# Patient Record
Sex: Female | Born: 1970
Health system: Southern US, Community
[De-identification: ages and names within clinical notes are randomized; demographics above are authoritative.]

## PROBLEM LIST (undated history)

## (undated) DIAGNOSIS — Z862 Personal history of diseases of the blood and blood-forming organs and certain disorders involving the immune mechanism: Secondary | ICD-10-CM

## (undated) DIAGNOSIS — M797 Fibromyalgia: Secondary | ICD-10-CM

## (undated) DIAGNOSIS — M1990 Unspecified osteoarthritis, unspecified site: Secondary | ICD-10-CM

## (undated) DIAGNOSIS — K219 Gastro-esophageal reflux disease without esophagitis: Secondary | ICD-10-CM

## (undated) DIAGNOSIS — E119 Type 2 diabetes mellitus without complications: Secondary | ICD-10-CM

## (undated) DIAGNOSIS — I10 Essential (primary) hypertension: Secondary | ICD-10-CM

## (undated) DIAGNOSIS — D649 Anemia, unspecified: Secondary | ICD-10-CM

## (undated) DIAGNOSIS — K900 Celiac disease: Secondary | ICD-10-CM

## (undated) DIAGNOSIS — L409 Psoriasis, unspecified: Secondary | ICD-10-CM

## (undated) DIAGNOSIS — F419 Anxiety disorder, unspecified: Secondary | ICD-10-CM

## (undated) DIAGNOSIS — T7840XA Allergy, unspecified, initial encounter: Secondary | ICD-10-CM

## (undated) DIAGNOSIS — E039 Hypothyroidism, unspecified: Secondary | ICD-10-CM

## (undated) DIAGNOSIS — E7800 Pure hypercholesterolemia, unspecified: Secondary | ICD-10-CM

## (undated) HISTORY — PX: ENDOMETRIAL ABLATION: SHX621

## (undated) HISTORY — DX: Celiac disease: K90.0

## (undated) HISTORY — DX: Unspecified osteoarthritis, unspecified site: M19.90

## (undated) HISTORY — DX: Essential (primary) hypertension: I10

## (undated) HISTORY — DX: Gastro-esophageal reflux disease without esophagitis: K21.9

## (undated) HISTORY — DX: Allergy, unspecified, initial encounter: T78.40XA

## (undated) HISTORY — DX: Anemia, unspecified: D64.9

## (undated) HISTORY — PX: TONSILLECTOMY: SUR1361

## (undated) HISTORY — DX: Personal history of diseases of the blood and blood-forming organs and certain disorders involving the immune mechanism: Z86.2

## (undated) HISTORY — DX: Hypothyroidism, unspecified: E03.9

## (undated) HISTORY — PX: ROTATOR CUFF REPAIR: SHX139

## (undated) HISTORY — DX: Type 2 diabetes mellitus without complications: E11.9

## (undated) HISTORY — PX: ESOPHAGOGASTRODUODENOSCOPY: SHX1529

## (undated) HISTORY — DX: Fibromyalgia: M79.7

## (undated) HISTORY — DX: Psoriasis, unspecified: L40.9

---

## 2001-04-07 ENCOUNTER — Encounter: Admission: RE | Admit: 2001-04-07 | Discharge: 2001-04-22 | Payer: Self-pay | Admitting: Orthopaedic Surgery

## 2001-12-01 ENCOUNTER — Other Ambulatory Visit: Admission: RE | Admit: 2001-12-01 | Discharge: 2001-12-01 | Payer: Self-pay | Admitting: Obstetrics and Gynecology

## 2002-01-14 ENCOUNTER — Emergency Department (HOSPITAL_COMMUNITY): Admission: EM | Admit: 2002-01-14 | Discharge: 2002-01-14 | Payer: Self-pay | Admitting: *Deleted

## 2002-01-14 ENCOUNTER — Encounter: Payer: Self-pay | Admitting: *Deleted

## 2002-05-04 ENCOUNTER — Ambulatory Visit (HOSPITAL_COMMUNITY): Admission: RE | Admit: 2002-05-04 | Discharge: 2002-05-04 | Payer: Self-pay | Admitting: Family Medicine

## 2002-05-04 ENCOUNTER — Encounter: Payer: Self-pay | Admitting: Family Medicine

## 2002-08-10 ENCOUNTER — Ambulatory Visit (HOSPITAL_BASED_OUTPATIENT_CLINIC_OR_DEPARTMENT_OTHER): Admission: RE | Admit: 2002-08-10 | Discharge: 2002-08-11 | Payer: Self-pay | Admitting: Otolaryngology

## 2002-08-10 ENCOUNTER — Encounter (INDEPENDENT_AMBULATORY_CARE_PROVIDER_SITE_OTHER): Payer: Self-pay | Admitting: *Deleted

## 2002-10-21 ENCOUNTER — Encounter: Payer: Self-pay | Admitting: Otolaryngology

## 2002-10-21 ENCOUNTER — Ambulatory Visit (HOSPITAL_COMMUNITY): Admission: RE | Admit: 2002-10-21 | Discharge: 2002-10-21 | Payer: Self-pay | Admitting: Otolaryngology

## 2003-12-20 ENCOUNTER — Emergency Department (HOSPITAL_COMMUNITY): Admission: EM | Admit: 2003-12-20 | Discharge: 2003-12-20 | Payer: Self-pay | Admitting: *Deleted

## 2003-12-26 ENCOUNTER — Ambulatory Visit (HOSPITAL_COMMUNITY): Admission: RE | Admit: 2003-12-26 | Discharge: 2003-12-26 | Payer: Self-pay | Admitting: Family Medicine

## 2004-01-17 ENCOUNTER — Ambulatory Visit (HOSPITAL_COMMUNITY): Admission: RE | Admit: 2004-01-17 | Discharge: 2004-01-17 | Payer: Self-pay | Admitting: Family Medicine

## 2004-10-23 ENCOUNTER — Ambulatory Visit (HOSPITAL_COMMUNITY): Admission: RE | Admit: 2004-10-23 | Discharge: 2004-10-23 | Payer: Self-pay | Admitting: Family Medicine

## 2006-05-08 ENCOUNTER — Ambulatory Visit: Payer: Self-pay | Admitting: Orthopedic Surgery

## 2007-10-14 ENCOUNTER — Ambulatory Visit (HOSPITAL_COMMUNITY): Admission: RE | Admit: 2007-10-14 | Discharge: 2007-10-14 | Payer: Self-pay | Admitting: Family Medicine

## 2007-11-12 ENCOUNTER — Ambulatory Visit (HOSPITAL_COMMUNITY): Admission: RE | Admit: 2007-11-12 | Discharge: 2007-11-12 | Payer: Self-pay | Admitting: Obstetrics and Gynecology

## 2007-11-12 ENCOUNTER — Encounter (INDEPENDENT_AMBULATORY_CARE_PROVIDER_SITE_OTHER): Payer: Self-pay | Admitting: Obstetrics and Gynecology

## 2008-08-14 ENCOUNTER — Emergency Department (HOSPITAL_COMMUNITY): Admission: EM | Admit: 2008-08-14 | Discharge: 2008-08-14 | Payer: Self-pay | Admitting: Emergency Medicine

## 2009-04-12 ENCOUNTER — Ambulatory Visit (HOSPITAL_COMMUNITY): Admission: RE | Admit: 2009-04-12 | Discharge: 2009-04-12 | Payer: Self-pay | Admitting: Obstetrics and Gynecology

## 2009-09-14 ENCOUNTER — Ambulatory Visit (HOSPITAL_COMMUNITY): Admission: RE | Admit: 2009-09-14 | Discharge: 2009-09-14 | Payer: Self-pay | Admitting: Family Medicine

## 2009-12-28 ENCOUNTER — Encounter: Admission: RE | Admit: 2009-12-28 | Discharge: 2009-12-28 | Payer: Self-pay | Admitting: Orthopedic Surgery

## 2011-01-20 ENCOUNTER — Encounter: Payer: Self-pay | Admitting: Orthopedic Surgery

## 2011-05-14 NOTE — Op Note (Signed)
Rebecca Carroll, Rebecca Carroll                  ACCOUNT NO.:  [id number]   MEDICAL RECORD NO.:  [id number]          PATIENT TYPE:  AMB   LOCATION:  SDC                           FACILITY:  [facility]   PHYSICIAN:  Malva Limes, M.D.    DATE OF BIRTH:  Sep 27, 1971   DATE OF PROCEDURE:  11/12/2007  DATE OF DISCHARGE:                               OPERATIVE REPORT   PREOPERATIVE DIAGNOSIS:  Menorrhagia.   POSTOPERATIVE DIAGNOSIS:  Menorrhagia.   PROCEDURE:  1. Dilatation and curettage.  2. NovaSure endometrial ablation.   SURGEON:  Malva Limes, M.D.   ANESTHESIA:  MAC with paracervical block.   SPECIMENS:  Endometrial curettings sent to pathology.   DRAINS:  Red rubber catheter in the bladder.   COMPLICATIONS:  None.   ESTIMATED BLOOD LOSS:  Minimal.   PROCEDURE:  Patient was taken to the operating room where she was placed  in the dorsal lithotomy position.  MAC anesthesia was administered.  She  was then prepped with Betadine and draped in the usual fashion for this  procedure.  Her bladder was drained with a catheter.  A sterile speculum  was placed into the vagina.  The patient had 20 cc of 1% lidocaine used  for a paracervical block.  A single-tooth tenaculum was applied to the  anterior cervical lip.  The cervix was fairly dilated to a 31 [place].  The uterus was sounded to 8.5 cm.  The cervical length was measured at 3  cm, giving it an intracavitary length of 5.5 cm.  At this point, sharp  curettage was performed, and the tissue sent to pathology.  The NovaSure  device was then placed into the uterine cavity and opened.  The width  was 3 cm.  A seal test was performed and passed.  The device was then  turned on for 1 minute, 3 seconds.   The patient tolerated the procedure well.  The device was removed.  The  patient was taken to the recovery room in stable condition.  Estimated  lap counts were correct x1.  The devices were removed.  The patient was  taken to the recovery room in  stable condition.  Instrument and lap  counts were correct x1.   The patient will be discharged to home.  She will be sent home with  Percocet to take p.r.n.  She will follow up in the office in four weeks.           ______________________________  Malva Limes, M.D.     MA/MEDQ  D:  11/12/2007  T:  11/12/2007  Job:  [id number]

## 2011-05-17 NOTE — Op Note (Signed)
   NAME:  Rebecca Carroll, SLIGAR                           ACCOUNT NO.:  [id number]   MEDICAL RECORD NO.:  [id number]                   PATIENT TYPE:  AMB   LOCATION:  DSC                                  FACILITY:  [facility]   PHYSICIAN:  Dorna Leitz, M.D.                 DATE OF BIRTH:  Oct 27, 1971   DATE OF PROCEDURE:  08/10/2002  DATE OF DISCHARGE:  08/11/2002                                 OPERATIVE REPORT   PREOPERATIVE DIAGNOSES:  Chronic tonsillitis.   POSTOPERATIVE DIAGNOSES:  Chronic tonsillitis.   OPERATION PERFORMED:  Tonsillectomy.   SURGEON:  Dorna Leitz, M.D.   ANESTHESIA:  General.   ESTIMATED BLOOD LOSS:  None.   COMPLICATIONS:  None.   INDICATIONS FOR PROCEDURE:  The patient is a 40 year old female who has had  chronic throat pain with recurrent tonsillar infections.  She has also had  zone 2 adenopathy.  For these reasons, tonsillectomy is performed.   FINDINGS:  The patient was noted to have somewhat cryptic and scarred  bilateral palatine tonsils.  There was minimal adenoid tissue and for this  reason, adenoidectomy was not performed.   DESCRIPTION OF PROCEDURE:  The patient was taken to the operating room and  placed on the table in the supine position.  She was then placed under  general endotracheal anesthesia and the table rotated counterclockwise 90  degrees.  The head was draped as well as the body in the usual fashion.  Bacitracin ointment was placed on the lips.  A Crowe-Davis mouth gag with a  #3 tongue blade was then placed intraorally, opened and suspended on the  Mayo stand.  Inspection of the nasopharynx was performed using a mirror.  The right palatine tonsil was grasped with Allis clamps and directed  inferomedially.  The Harmonic scalpel was then used to resect the tonsils  staying within the peritonsillar space.  The left palatine tonsil was  removed in identical fashion.  An NG tube was placed down the esophagus for  suctioning of the gastric  contents.  The mouth gag was removed noting no  damage to the teeth or soft tissues.  The table was then rotated clockwise  90 degrees to its original position.  The patient was awakened from  anesthesia and extubated in the operating room.  She was taken to the post  anesthesia care unit in stable condition.                                                 Dorna Leitz, M.D.    SLJ/MEDQ  D:  09/16/2002  T:  09/17/2002  Job:  [id number]

## 2011-05-23 ENCOUNTER — Other Ambulatory Visit: Payer: Self-pay | Admitting: Family Medicine

## 2011-05-23 DIAGNOSIS — R1011 Right upper quadrant pain: Secondary | ICD-10-CM

## 2011-05-24 ENCOUNTER — Ambulatory Visit (HOSPITAL_COMMUNITY)
Admission: RE | Admit: 2011-05-24 | Discharge: 2011-05-24 | Disposition: A | Discharge status: Home / Self Care | Payer: 59 | Source: Ambulatory Visit | Attending: Family Medicine | Admitting: Family Medicine

## 2011-05-24 DIAGNOSIS — R1011 Right upper quadrant pain: Secondary | ICD-10-CM | POA: Insufficient documentation

## 2011-06-04 ENCOUNTER — Other Ambulatory Visit: Payer: Self-pay | Admitting: Family Medicine

## 2011-06-07 ENCOUNTER — Encounter (HOSPITAL_COMMUNITY): Admission: RE | Admit: 2011-06-07 | Payer: 59 | Source: Ambulatory Visit

## 2011-06-12 ENCOUNTER — Encounter (HOSPITAL_COMMUNITY): Payer: 59

## 2011-06-18 ENCOUNTER — Encounter (HOSPITAL_COMMUNITY): Admission: RE | Admit: 2011-06-18 | Payer: 59 | Source: Ambulatory Visit

## 2011-07-12 DIAGNOSIS — F411 Generalized anxiety disorder: Secondary | ICD-10-CM | POA: Insufficient documentation

## 2011-07-12 DIAGNOSIS — R6889 Other general symptoms and signs: Secondary | ICD-10-CM | POA: Insufficient documentation

## 2011-07-13 ENCOUNTER — Encounter: Payer: Self-pay | Admitting: Emergency Medicine

## 2011-07-13 ENCOUNTER — Emergency Department (HOSPITAL_COMMUNITY)
Admission: EM | Admit: 2011-07-13 | Discharge: 2011-07-13 | Disposition: A | Discharge status: Home / Self Care | Payer: 59 | Attending: Emergency Medicine | Admitting: Emergency Medicine

## 2011-07-13 HISTORY — DX: Anxiety disorder, unspecified: F41.9

## 2011-07-13 HISTORY — DX: Pure hypercholesterolemia, unspecified: E78.00

## 2011-07-13 NOTE — ED Notes (Signed)
Patient called for room placement; 2nd time; no answer.

## 2011-07-13 NOTE — ED Notes (Signed)
Patient states her anxiety has increased today; states she has a feeling of something being stuck in her throat.  Patient states she has appointment with ENT for this, but thinks her anxiety level is making it worse.

## 2011-07-13 NOTE — ED Notes (Signed)
Called for room placement, no answer.

## 2011-07-13 NOTE — ED Notes (Signed)
Patient called third time for room placement with no answer.

## 2011-08-23 ENCOUNTER — Inpatient Hospital Stay (INDEPENDENT_AMBULATORY_CARE_PROVIDER_SITE_OTHER)
Admission: RE | Admit: 2011-08-23 | Discharge: 2011-08-23 | Disposition: A | Discharge status: Home / Self Care | Payer: 59 | Source: Ambulatory Visit | Attending: Emergency Medicine | Admitting: Emergency Medicine

## 2011-08-23 DIAGNOSIS — J40 Bronchitis, not specified as acute or chronic: Secondary | ICD-10-CM

## 2011-08-29 ENCOUNTER — Inpatient Hospital Stay (INDEPENDENT_AMBULATORY_CARE_PROVIDER_SITE_OTHER)
Admission: RE | Admit: 2011-08-29 | Discharge: 2011-08-29 | Disposition: A | Discharge status: Home / Self Care | Payer: 59 | Source: Ambulatory Visit | Attending: Emergency Medicine | Admitting: Emergency Medicine

## 2011-08-29 DIAGNOSIS — R059 Cough, unspecified: Secondary | ICD-10-CM

## 2011-08-29 DIAGNOSIS — R05 Cough: Secondary | ICD-10-CM

## 2011-09-20 ENCOUNTER — Other Ambulatory Visit: Payer: Self-pay | Admitting: Family Medicine

## 2011-09-20 DIAGNOSIS — Z139 Encounter for screening, unspecified: Secondary | ICD-10-CM

## 2011-09-23 ENCOUNTER — Ambulatory Visit (HOSPITAL_COMMUNITY)
Admission: RE | Admit: 2011-09-23 | Discharge: 2011-09-23 | Disposition: A | Discharge status: Home / Self Care | Payer: 59 | Source: Ambulatory Visit | Attending: Family Medicine | Admitting: Family Medicine

## 2011-09-23 DIAGNOSIS — Z139 Encounter for screening, unspecified: Secondary | ICD-10-CM

## 2011-09-23 DIAGNOSIS — Z1231 Encounter for screening mammogram for malignant neoplasm of breast: Secondary | ICD-10-CM | POA: Insufficient documentation

## 2011-09-25 ENCOUNTER — Other Ambulatory Visit: Payer: Self-pay | Admitting: Family Medicine

## 2011-09-25 DIAGNOSIS — R928 Other abnormal and inconclusive findings on diagnostic imaging of breast: Secondary | ICD-10-CM

## 2011-09-26 ENCOUNTER — Other Ambulatory Visit: Payer: Self-pay | Admitting: Family Medicine

## 2011-09-26 DIAGNOSIS — R928 Other abnormal and inconclusive findings on diagnostic imaging of breast: Secondary | ICD-10-CM

## 2011-10-07 ENCOUNTER — Other Ambulatory Visit: Payer: Self-pay | Admitting: Family Medicine

## 2011-10-07 ENCOUNTER — Ambulatory Visit
Admission: RE | Admit: 2011-10-07 | Discharge: 2011-10-07 | Disposition: A | Discharge status: Home / Self Care | Payer: 59 | Source: Ambulatory Visit | Attending: Family Medicine | Admitting: Family Medicine

## 2011-10-07 DIAGNOSIS — R928 Other abnormal and inconclusive findings on diagnostic imaging of breast: Secondary | ICD-10-CM

## 2011-10-07 DIAGNOSIS — N644 Mastodynia: Secondary | ICD-10-CM

## 2011-10-08 LAB — CBC
HCT: 36.9
Hemoglobin: 12.7
MCHC: 34.5
MCV: 87.8
Platelets: 298
RBC: 4.21
RDW: 12.8
WBC: 5.8

## 2011-10-08 LAB — PREGNANCY, URINE: Preg Test, Ur: NEGATIVE

## 2011-10-09 ENCOUNTER — Other Ambulatory Visit: Payer: Self-pay | Admitting: Family Medicine

## 2011-10-09 ENCOUNTER — Ambulatory Visit (HOSPITAL_COMMUNITY)
Admission: RE | Admit: 2011-10-09 | Discharge: 2011-10-09 | Disposition: A | Discharge status: Home / Self Care | Payer: 59 | Source: Ambulatory Visit | Attending: Family Medicine | Admitting: Family Medicine

## 2011-10-09 DIAGNOSIS — R079 Chest pain, unspecified: Secondary | ICD-10-CM | POA: Insufficient documentation

## 2011-10-17 ENCOUNTER — Ambulatory Visit (INDEPENDENT_AMBULATORY_CARE_PROVIDER_SITE_OTHER): Payer: 59 | Admitting: Otolaryngology

## 2011-10-18 ENCOUNTER — Encounter (HOSPITAL_COMMUNITY)
Admission: RE | Admit: 2011-10-18 | Discharge: 2011-10-18 | Payer: 59 | Source: Ambulatory Visit | Attending: Family Medicine | Admitting: Family Medicine

## 2011-10-22 ENCOUNTER — Other Ambulatory Visit (HOSPITAL_COMMUNITY): Payer: 59

## 2011-10-23 ENCOUNTER — Encounter (HOSPITAL_COMMUNITY)
Admission: RE | Admit: 2011-10-23 | Discharge: 2011-10-23 | Disposition: A | Discharge status: Home / Self Care | Payer: 59 | Source: Ambulatory Visit | Attending: Family Medicine | Admitting: Family Medicine

## 2011-10-23 ENCOUNTER — Encounter (HOSPITAL_COMMUNITY): Payer: Self-pay

## 2011-10-23 DIAGNOSIS — R109 Unspecified abdominal pain: Secondary | ICD-10-CM | POA: Insufficient documentation

## 2011-10-23 MED ORDER — TECHNETIUM TC 99M MEBROFENIN IV KIT
5.0000 | PACK | Freq: Once | INTRAVENOUS | Status: AC | PRN
  Administered 2011-10-23: 5.2 via INTRAVENOUS

## 2011-10-23 MED ORDER — SINCALIDE 5 MCG IJ SOLR
INTRAMUSCULAR | Status: AC
  Administered 2011-10-23: 1.4 ug
  Filled 2011-10-23: qty 5

## 2011-11-26 ENCOUNTER — Encounter: Payer: Self-pay | Admitting: Gastroenterology

## 2011-12-05 ENCOUNTER — Ambulatory Visit: Payer: 59 | Admitting: Gastroenterology

## 2011-12-17 ENCOUNTER — Encounter: Payer: Self-pay | Admitting: Internal Medicine

## 2011-12-26 ENCOUNTER — Encounter: Payer: Self-pay | Admitting: Internal Medicine

## 2011-12-26 ENCOUNTER — Ambulatory Visit (INDEPENDENT_AMBULATORY_CARE_PROVIDER_SITE_OTHER): Payer: 59 | Admitting: Internal Medicine

## 2011-12-26 DIAGNOSIS — R1013 Epigastric pain: Secondary | ICD-10-CM

## 2011-12-26 DIAGNOSIS — K219 Gastro-esophageal reflux disease without esophagitis: Secondary | ICD-10-CM

## 2011-12-26 DIAGNOSIS — M797 Fibromyalgia: Secondary | ICD-10-CM | POA: Insufficient documentation

## 2011-12-26 DIAGNOSIS — I10 Essential (primary) hypertension: Secondary | ICD-10-CM | POA: Insufficient documentation

## 2011-12-26 DIAGNOSIS — E785 Hyperlipidemia, unspecified: Secondary | ICD-10-CM | POA: Insufficient documentation

## 2011-12-26 DIAGNOSIS — E039 Hypothyroidism, unspecified: Secondary | ICD-10-CM | POA: Insufficient documentation

## 2011-12-26 DIAGNOSIS — R198 Other specified symptoms and signs involving the digestive system and abdomen: Secondary | ICD-10-CM

## 2011-12-26 MED ORDER — PEG-KCL-NACL-NASULF-NA ASC-C 100 G PO SOLR: 1.0000 | Freq: Once | ORAL | Status: DC

## 2011-12-26 MED ORDER — ALIGN 4 MG PO CAPS: 1.0000 | ORAL_CAPSULE | Freq: Every day | ORAL | Status: DC

## 2011-12-26 NOTE — Progress Notes (Signed)
Subjective:    Patient ID: Rebecca Carroll, female    DOB: 09/12/1971, 40 y.o.   MRN: [id number]  HPI [name] is a 40 year old female with a past medical history of fibromyalgia, hypertension and GERD who seen in consultation at the request of Dr. Gerda Diss for evaluation of persistent GERD and change in bowel habits. The patient reports being diagnosed with GERD/reflux disease after one year of trouble swallowing and globus sensation. She was initially seen by ENT and underwent direct laryngoscopy which revealed inflammation. She was on omeprazole at that time and the dose was then doubled him a which did help relieve her symptoms of dysphagia and globus.  Then, her dose was returned to once daily omeprazole 20 mg, which she remains on now. She also reports trouble with upper abdominal pain, which she does not specifically relate to eating. She has tried to watch her diet to see what induces the pain, but she's not been able to make any association. She reports the pain is intermittent and occasionally feels like knots in her upper abdomen. She reports having undergone an ultrasound and HIDA scan which were negative.  She reports her appetite remains good and her weight has been stable. No nausea or vomiting. No melena   She also notes a change in her bowel habits over the last 2-3 months. Prior to this (and for as long as she can remember) her bowel movements have been normal for her, which for her was one BM daily.  Now she reports alternating trouble with diarrhea and constipation. She also reports ribbon thin stools.  She has some associated lower abdominal pain. She notes increased bloating as well as gas. No melena or bright red blood per rectum.  Occasional ibuprofen use, estimated 1-2 times weekly.  Review of Systems Constitutional: Negative for fever, chills, night sweats, activity change, appetite change and unexpected weight change HEENT: Negative for sore throat, mouth sores and trouble  swallowing. Eyes: Negative for visual disturbance Respiratory: Negative for cough, chest tightness and shortness of breath Cardiovascular: Negative for chest pain, palpitations and lower extremity swelling Gastrointestinal: See history of present illness Genitourinary: Negative for dysuria and hematuria. Musculoskeletal: Negative for back pain, arthralgias and myalgias Skin: Negative for rash or color change Neurological: Negative for headaches, weakness, numbness Hematological: Negative for adenopathy, negative for easy bruising/bleeding Psychiatric/behavioral: Negative for depressed mood, negative for anxiety  Past Medical History  Diagnosis Date  . Anxiety   . Hypertension   . High cholesterol   . GERD (gastroesophageal reflux disease)   . Fibromyalgia   . Migraine   . Hypothyroidism   . History of anemia    Past Surgical History  Procedure Date  . Tonsillectomy   . Endometrial ablation    Current Outpatient Prescriptions  Medication Sig Dispense Refill  . DULoxetine (CYMBALTA) 60 MG capsule Take 60 mg by mouth daily.        Marland Kitchen levothyroxine (SYNTHROID, LEVOTHROID) 25 MCG tablet Take 25 mcg by mouth daily.        Marland Kitchen losartan-hydrochlorothiazide (HYZAAR) 50-12.5 MG per tablet Take 1 tablet by mouth daily.        Marland Kitchen omeprazole (PRILOSEC) 20 MG capsule Take 20 mg by mouth daily.        . simvastatin (ZOCOR) 20 MG tablet Take 20 mg by mouth at bedtime.        . peg 3350 powder (MOVIPREP) 100 G SOLR Take 1 kit (100 g total) by mouth once.  1 kit  0  . Probiotic Product (ALIGN) 4 MG CAPS Take 1 capsule by mouth daily.  30 capsule  0   No Known Allergies  Family History  Problem Relation Age of Onset  . Lung cancer Maternal Grandfather   . Diabetes Son   . Heart disease Maternal Grandmother   . Heart disease Paternal Grandmother   . Diverticulosis Father   . Hypertension Father   . Lung cancer Maternal Aunt   . Cirrhosis Paternal Uncle    Social History  . Marital Status:  Married    Number of Children: 2   Occupational History  . nurse sec [employer]   Social History Main Topics  . Smoking status: Never Smoker   . Smokeless tobacco: Never Used  . Alcohol Use: Yes     social  . Drug Use: No      Objective:   Physical Exam BP 146/100  Pulse 88  Ht 5\' 2"  (1.575 m)  Wt 155 lb (70.308 kg)  BMI 28.35 kg/m2 Constitutional: Well-developed and well-nourished. No distress. HEENT: Normocephalic and atraumatic. Oropharynx is clear and moist. No oropharyngeal exudate. Conjunctivae are normal. Pupils are equal round and reactive to light. No scleral icterus. Neck: Neck supple. Trachea midline. Cardiovascular: Normal rate, regular rhythm and intact distal pulses. No M/R/G Pulmonary/chest: Effort normal and breath sounds normal. No wheezing, rales or rhonchi. Abdominal: Soft, mild epigastric tenderness without rebound or guarding, nondistended. Bowel sounds active throughout. There are no masses palpable. No hepatosplenomegaly. Extremities: no clubbing, cyanosis, or edema Lymphadenopathy: No cervical adenopathy noted. Neurological: Alert and oriented to person place and time. Skin: Skin is warm and dry. No rashes noted. Psychiatric: Normal mood and affect. Behavior is normal.  HIDA 10/23/2011 No acute findings, normal gallbladder ejection fraction    Assessment & Plan:  40 year old female with a past medical history of fibromyalgia, hypertension and GERD who seen in consultation at the request of Dr. Gerda Diss for evaluation of persistent GERD and change in bowel habits  1. GERD/epigastric pain -- given the patient's persistent upper GI pain on PPI therapy, we will proceed with endoscopy. I will also plan to small bowel biopsies to rule out celiac disease.  For now I recommend she continue omeprazole 20 mg daily, but this may need to be increased in the future to a higher dose, or another more potent PPI.  2. Change in bowel habits -- some of her symptoms are  consistent with irritable bowel syndrome, however this is only been an issue for her over the last 2-3 months. Prior to this she does not have a history of irritable bowel type syndromes, making irritable bowel felt less likely at present.  She has recently been diagnosed with hypothyroidism, however with a normal TSH, it is unlikely that this is contributing significantly to her change in bowel habits. Given her change in bowel habits we will proceed with colonoscopy, which will be done at the same time as her upper endoscopy. Both tests were discussed with her today and she is agreeable to proceed.  Followup for 6 weeks (after colonoscopy and endoscopy).

## 2011-12-26 NOTE — Patient Instructions (Signed)
You have been scheduled for a Upper Endoscopy/ Colonoscopy with propofol. See separate instructions. Pick up your prep kit from your pharmacy.  Start Align samples one tablet by mouth once daily x 1 month.  cc: Lilyan Punt, MD

## 2012-01-09 ENCOUNTER — Encounter: Payer: Self-pay | Admitting: Internal Medicine

## 2012-01-09 ENCOUNTER — Telehealth: Payer: Self-pay | Admitting: *Deleted

## 2012-01-09 ENCOUNTER — Ambulatory Visit: Payer: 59 | Admitting: Internal Medicine

## 2012-01-09 ENCOUNTER — Ambulatory Visit (AMBULATORY_SURGERY_CENTER): Payer: 59 | Admitting: Internal Medicine

## 2012-01-09 VITALS — BP 143/96 | HR 83 | Temp 98.5°F | Resp 12 | Ht 62.0 in | Wt 155.0 lb

## 2012-01-09 DIAGNOSIS — K900 Celiac disease: Secondary | ICD-10-CM

## 2012-01-09 DIAGNOSIS — K219 Gastro-esophageal reflux disease without esophagitis: Secondary | ICD-10-CM

## 2012-01-09 DIAGNOSIS — R1013 Epigastric pain: Secondary | ICD-10-CM

## 2012-01-09 MED ORDER — SODIUM CHLORIDE 0.9 % IV SOLN: 500.0000 mL | INTRAVENOUS | Status: DC

## 2012-01-09 NOTE — Progress Notes (Signed)
Patient did not experience any of the following events: a burn prior to discharge; a fall within the facility; wrong site/side/patient/procedure/implant event; or a hospital transfer or hospital admission upon discharge from the facility. (G8907) Patient did not have preoperative order for IV antibiotic SSI prophylaxis. (G8918)  

## 2012-01-09 NOTE — Patient Instructions (Signed)
Normal esophagus- and stomach. Dr Rhea Belton did do biopsies and we will mail you a letter in 1-2 weeks with the pathology results and dr pyrtle's recommendations  Discharge instructions per blue and green sheets  Continue taking your reflux medicine as directed. It is best to take this medicine 20-30 minutes prior to your first meal of the day  Colonoscopy can be schedules with a different prep as per pt and MD.

## 2012-01-09 NOTE — Progress Notes (Signed)
PATIENT STATING ONLY ONE BOWEL MOVEMENT SINCE TAKING HER PREP. SOLID STOOL. 2ND BOWEL MOVEMENT  ONCE ADMITTED. SOLID STOOL. DISCUSSED WITH DR. PYRTLE. PATIENT SEEN BY DR. PYRTLE, WILL PROCEED WITH ENDOSCOPY AND RESCHEDULE COLONOSCOPY. PATIENT IN AGREEMENT.

## 2012-01-09 NOTE — Telephone Encounter (Signed)
Initial call was this am around 8:30am.   The pt was sent up to speak with a nurse, and I took the call.   Pt. Stated that she had a very bad headache and was not taking her advil due to the instructions.  She also stated that she hadn't had a bowel movement yet.  I told her to go and take her second prep and maybe that would help.   I also told her to go ahead and take advil for her headache as she is prone to migraines.    Patient called back at 11:35am and stated that she did not have a bowel movement yet.  I told Dr. Rhea Belton, and he said to have the patient take enemas til clear, and also a bottle of manesium citrate.  Unfortunately, the patient has to leave the house at 11:45am to get her in time for her procedure.  Pt. Instructed to come in anyway for the enemas and that we could also do her EGD.  She agreed.

## 2012-01-10 ENCOUNTER — Telehealth: Payer: Self-pay | Admitting: *Deleted

## 2012-01-10 NOTE — Telephone Encounter (Signed)

## 2012-01-13 ENCOUNTER — Telehealth: Payer: Self-pay | Admitting: *Deleted

## 2012-01-13 NOTE — Op Note (Signed)
[facility] [address] [place], [place]  [address]  ENDOSCOPY PROCEDURE REPORT  PATIENT:  Rebecca Carroll, Rebecca Carroll  MR#:  [id number] BIRTHDATE:  1971/03/20, 40 yrs. old  GENDER:  female ENDOSCOPIST:  Carie Caddy. Townsend Cudworth, MD Referred by:  Lilyan Punt, M.D. PROCEDURE DATE:  01/09/2012 PROCEDURE:  EGD with biopsy, [id number] ASA CLASS:  Class II INDICATIONS:  epigastric pain MEDICATIONS:    MAC sedation, administered by CRNA, propofol (Diprivan) 250 mg IV TOPICAL ANESTHETIC:  none  DESCRIPTION OF PROCEDURE:   After the risks benefits and alternatives of the procedure were thoroughly explained, informed consent was obtained.  The LB [id number]-H180 [id number] endoscope was introduced through the mouth and advanced to the third portion of the duodenum, without limitations.  The instrument was slowly withdrawn as the mucosa was fully examined. <<PROCEDUREIMAGES>> The esophagus and gastroesophageal junction were completely normal in appearance.  The stomach was entered and closely examined. The antrum, angularis, and lesser curvature were well visualized, including a retroflexed view of the cardia and fundus. The stomach wall was normally distensable. The scope passed easily through the pylorus into the duodenum.  Mild scalloping of the mucosa was found in the bulb and descending duodenum. Multiple biopsies were obtained and sent to pathology to exclude celiac disease. Retroflexed views revealed no abnormalities.    The scope was then withdrawn from the patient and the procedure completed.  COMPLICATIONS:  None  ENDOSCOPIC IMPRESSION:  1) Normal esophagus 2)  Normal stomach 3) Mild scalloping of the mucosa in the bulb/descending duodenum.  Multiple biopsies taken to evaluate for and rule out celiac disease.  RECOMMENDATIONS: 1) Await pathology results 2) Continue taking your PPI (antiacid medicine) once daily. It is best to be taken 20-30 minutes prior to breakfast meal. 3) Colonoscopy can be  rescheduled with different bowel preparation.  Carie Caddy. Rhea Belton, MD  CC:  The Patient Lilyan Punt, MD  n. Rosalie DoctorCarie Caddy. Aneshia Jacquet at 01/13/2012 12:33 PM  Nena Polio, [phone]

## 2012-01-13 NOTE — Telephone Encounter (Signed)
lmom for pt to call back. Dr Rhea Belton wants pt to have a 2 day prep for her COLON. Mailed pt instructions for 2 day prep with 1/2 of Suprep on day 1 followed by 2 day prep with Miralax. She has been scheduled for 01/21/12 in order to enter the Prep info. I will leave her a SUPREP up front.

## 2012-01-14 ENCOUNTER — Encounter: Payer: Self-pay | Admitting: Internal Medicine

## 2012-01-14 ENCOUNTER — Telehealth: Payer: Self-pay | Admitting: *Deleted

## 2012-01-14 DIAGNOSIS — K900 Celiac disease: Secondary | ICD-10-CM

## 2012-01-14 NOTE — Telephone Encounter (Signed)
Letter from: Beverley Fiedler Reason for Letter: Results Review Please cancel upcoming colonoscopy.  Please schedule for nutrition outpatient consultation re: New diagnosis celiac disease.  Office F/u 4-6 weeks

## 2012-01-14 NOTE — Telephone Encounter (Signed)
See next note from 01/14/12.

## 2012-01-14 NOTE — Telephone Encounter (Signed)
Informed pt someone will call [facility] from [facility]. Scheduled f/u with Dr Rhea Belton for 02/17/12 @ 0900am; pt stated understanding.

## 2012-01-21 ENCOUNTER — Other Ambulatory Visit: Payer: 59 | Admitting: Internal Medicine

## 2012-01-23 ENCOUNTER — Encounter: Payer: 59 | Attending: Internal Medicine | Admitting: Dietician

## 2012-01-23 ENCOUNTER — Encounter: Payer: Self-pay | Admitting: Dietician

## 2012-01-23 DIAGNOSIS — Z713 Dietary counseling and surveillance: Secondary | ICD-10-CM | POA: Insufficient documentation

## 2012-01-23 DIAGNOSIS — K900 Celiac disease: Secondary | ICD-10-CM | POA: Insufficient documentation

## 2012-01-23 NOTE — Patient Instructions (Signed)
   Misty Stanley,  I will include the order blank for the Shopping Guide for the Gluten-Free Consumer.  I found it after you left our appointment.  Continue to try to have your section of the kitchen to store your flour and baked goods.  Have your own stash of Gluten-Free goodies.  Plan to carry meals and snacks for work.  Our resources at the hospital Leza Apsey be limited in the baked goods selection.  Remember, as time goes on, you Kensie Susman be more intolerant, and you will have to look at the gluten in medications and in lipstick and other items that you Namine Beahm take orally.   The Celiac Association can provide list for gluten free candies and other treats.  Good idea to have your son checked-out by the GI doctor.  Good luck.  I and my colleagues Huntley Dec and Amy are available to help you out with any issues..  I enjoyed working with you today.  Let me know if I can be of further assistance.  Maggie [phone] and office is [phone].

## 2012-01-23 NOTE — Progress Notes (Signed)
  Medical Nutrition Therapy:  Appt start time: 0900 end time:  1000.  Assessment:  Primary concerns today: Recent diagnosis of Celiac disease.  Verbalizes concerns regarding what to eat, and where to go to find products that are gluten free.  We did a brief on what Celiac disease is and how it affects other body systems.  Gives no history of weight loss, but has had anemia in the past.  History of fibromyalgia, and migraine H/A, and a history of GERD and other stomach issues.  She is concerned that her 43 year old son who is loosing weight might have the same issues and plans to have him see the GI physician.  Her other son has a history of Type 1 diabetes.  MEDICATIONS: currently taking all prescribed medications.  I failed to remind her to speak with her MD regarding the use of a vitamin supplement.  DIETARY INTAKE: Our visit included a review of the gluten free foods and those foods containing wheat, barley, and rye.  We looked at approaches to getting a normal diet and interventions to limit gluten containing products.  We explored the issue of eating out.  I encouraged her to search out a bread that she finds acceptable and plan to have it on hand just for her frozen at all times.  She may consider trying to bake bread.  She enjoys bread, but it is not the major food she enjoys.  Recent physical activity: Has good energy.  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  Estimated energy needs:HT: 62 in  WT: 156.7  BMI:28.7% Adjusted weight: 125 lb  (56 kg). 1300 calories 145-150 g carbohydrates 95-100 g protein 34-36 g fat  Progress Towards Goal(s):  In progress.   Nutritional Diagnosis:  [place]-2.1 Inpaired nutrition utilization As related to gluten and gluten containing products (Wheat, Barley, Rye).  As evidenced by abd. pain and diagnosis of Celiac disease.    Intervention:  Nutrition Trying to adjust to living gluten free.  Having problems finding an acceptable bread  product.  Starting to read label with more detail.  Interested in getting the Gluten [employer] and learning more about the [place] [employer] Gluten [employer].  Handouts given during visit include:  Handout to join the [place] [employer]uten [employer].  Advertising copywriter for [employer] "The Gluten-Free Gourmet"  Resource for [employer] "Gluten Free Diet"  [employer], [employer] for ordering backed goods.  [place] [employer]uten [employer]  "Gluten-Free Eating"  "Nutrition Complications for Celiac Disease"  Monitoring/Evaluation:  Dietary intake, exercise,  and body weight as needed, to call or e-mail with questions.

## 2012-02-13 ENCOUNTER — Encounter: Payer: Self-pay | Admitting: Internal Medicine

## 2012-02-17 ENCOUNTER — Ambulatory Visit (INDEPENDENT_AMBULATORY_CARE_PROVIDER_SITE_OTHER): Payer: 59 | Admitting: Internal Medicine

## 2012-02-17 ENCOUNTER — Other Ambulatory Visit (INDEPENDENT_AMBULATORY_CARE_PROVIDER_SITE_OTHER): Payer: 59

## 2012-02-17 ENCOUNTER — Encounter: Payer: Self-pay | Admitting: Internal Medicine

## 2012-02-17 VITALS — BP 138/96 | HR 60 | Ht 62.0 in | Wt 155.8 lb

## 2012-02-17 DIAGNOSIS — K900 Celiac disease: Secondary | ICD-10-CM

## 2012-02-17 LAB — CBC WITH DIFFERENTIAL/PLATELET
Basophils Absolute: 0 10*3/uL (ref 0.0–0.1)
Basophils Relative: 0.4 % (ref 0.0–3.0)
Eosinophils Absolute: 0.2 10*3/uL (ref 0.0–0.7)
Eosinophils Relative: 3.8 % (ref 0.0–5.0)
HCT: 35.8 % — ABNORMAL LOW (ref 36.0–46.0)
Hemoglobin: 11.9 g/dL — ABNORMAL LOW (ref 12.0–15.0)
Lymphocytes Relative: 24.4 % (ref 12.0–46.0)
Lymphs Abs: 1.3 10*3/uL (ref 0.7–4.0)
MCHC: 33.4 g/dL (ref 30.0–36.0)
MCV: 87.7 fl (ref 78.0–100.0)
Monocytes Absolute: 0.4 10*3/uL (ref 0.1–1.0)
Monocytes Relative: 8.5 % (ref 3.0–12.0)
Neutro Abs: 3.2 10*3/uL (ref 1.4–7.7)
Neutrophils Relative %: 62.9 % (ref 43.0–77.0)
Platelets: 270 10*3/uL (ref 150.0–400.0)
RBC: 4.08 Mil/uL (ref 3.87–5.11)
RDW: 13.2 % (ref 11.5–14.6)
WBC: 5.2 10*3/uL (ref 4.5–10.5)

## 2012-02-17 LAB — IGA: IgA: 280 mg/dL (ref 68–378)

## 2012-02-17 LAB — IBC PANEL
Iron: 69 ug/dL (ref 42–145)
Saturation Ratios: 17.8 % — ABNORMAL LOW (ref 20.0–50.0)
Transferrin: 277.1 mg/dL (ref 212.0–360.0)

## 2012-02-17 NOTE — Patient Instructions (Signed)
Your physician has requested that you go to the basement for  lab work before leaving today.  Gluten-Free Diet Gluten is a protein found in many grains. Gluten is present in wheat, rye, and barley. Gluten may cause intestinal injury when ingested by people who are sensitive to gluten. A tissue sample (biopsy) of the small intestine is usually required for a positive diagnosis of gluten sensitivity. Dietary treatment consists of eliminating foods and food ingredients from wheat, rye, and barley. When these are excluded completely from the diet, most patients regain function of the small intestine. Strict compliance is important even during symptom-free periods. Gluten sensitive patients must realize that this is a lifelong diet. During the first stages of treatment, some people will also need to restrict dairy products that contain lactose, which is a naturally occurring sugar. Lactose is difficult to absorb when the small intestines are damaged. This is called lactose intolerance. WHY YOU NEED THIS DIET Ask your caregiver to explain the conditions that you may have.  Celiac disease / nontropical sprue / gluten-sensitive enteropathy.   Dermatitis herpetiformis.  SPECIAL NOTES  Gluten from wheat, rye, and barley protein interferes with absorbing food in individuals with gluten sensitivity. It is important to read all labels, as gluten may have been added as an incidental ingredient. Words to check for on the label include: flour, starch, durum flour, graham flour, phosphated flour, self-rising flour, semolina, farina, modified food starch, cereal, thickening, fillers, emulsifiers, malt flavoring, hydrolyzed vegetable protein. A Registered Dietician can help you identify possible harmful ingredients in the foods you normally eat.   If you are not sure whether an ingredient contains gluten, be sure to check with the manufacturer. Note that some manufacturers may change ingredients without notice. Always  read labels.  Since flour and cereal products are quite often used in the preparation of foods, it is important to be aware of the methods of preparation used, as well as the foods themselves. This is especially true when you are dining out. Starches  Allowed: Only those prepared from arrowroot, corn, potato, rice, and bean flours. Rice wafers (*); pure cornmeal tortillas; popcorn; some crackers and chips(*). Hot cereals made from cornmeal; Cream of Rice. Cold cereals such as puffed rice, Kellogg's Sugar Pops, Post's Fruity & Chocolate Pebbles, [employer] and [employer], Featherweight's [employer]; [employer] Cocoa Puffs, and Gluten-free Oatmeal. White or sweet potatoes; yams; hominy; rice or wild rice; special gluten-free pasta. Some oriental rice noodles or bean noodles.   Avoid: All wheat, and rye cereals; wheat germ, barley, bran, graham, malt, bulgur, and millet (-). NOTE: Avoid cereals containing malt as a flavoring such as Rice Krispies. Regular noodles, spaghetti, macaroni, most packaged rice mixes(*). All others containing wheat, rye, and/or barley.  Vegetables  Allowed: All plain, fresh, frozen, or canned vegetables.   Avoid: Creamed vegetables(*), vegetables canned in sauces(*). Any prepared with wheat, rye, or barley.  Fruit  Allowed: All fresh, frozen, canned, or dried fruits. Fruit juices.   Avoid: Thickened or prepared fruits; some pie fillings(*).  Meat and Meat Substitutes  Allowed: Meat, fish, poultry, or eggs prepared without added wheat, rye, or barley; luncheon meat(*), frankfurters(*), and pure meat. All aged cheese, processed cheese products(*). Cottage cheese(+), cream cheese(+). Dried beans and peas; lentils.   Avoid: Any meat or meat alternate containing wheat, rye, barley, or gluten stabilizers; Bread-containing products such as Swiss steak, croquettes, and meatloaf. Tuna canned in vegetable broth(*); [place] with HVP injected as part of  the  basting; any cheese product containing oat gum as an ingredient.  Milk  Allowed: Milk. Yogurt made with allowed ingredients(*).   Avoid: Commercial chocolate milk which may have cereal added(*). Malted milk.  Soups and Combination Foods  Allowed: Homemade broth and soups made with allowed ingredients; some canned or frozen soups are allowed(*). Combination or prepared foods that do not contain gluten(*). Read labels.   Avoid: All soups containing wheat, rye, or barley flour. Bouillon and bouillon cubes that contain hydrolyzed vegetable protein (HVP). Combination or prepared foods that do contain gluten(*).  Desserts  Allowed: Custard, junket, homemade puddings from cornstarch, rice, and tapioca; some pudding mixes(*). Gelatin desserts, ices, and sherbet(*). Cake, cookies, and other desserts prepared with allowed flours.Some commercial ice creams(*).   Avoid: Cakes, cookies, doughnuts, pastries, etc., prepared with wheat, rye, and/or barley flour. Some commercial ice creams(*), ice cream flavors which contain cookies, crumbs, or cheesecake(*); ice cream cones. All commercially prepared mixes for cakes, cookies, and other desserts(*); bread pudding; puddings thickened with flour.  Sweets  Allowed: Sugar, honey, syrup(*), molasses, jelly, jam, plain hard candy, marshmallows, gumdrops, homemade candies free from wheat, rye, or barley. Coconut.   Avoid: Commercial candies containing wheat, rye, or barley(*). [place] is dusted with wheat flour. Chocolate-coated nuts, which are often rolled in flour.  Fats and Oils  Allowed: Butter, margarine, vegetable oil, sour cream(+), whipping cream, shortening, lard, cream, mayonnaise(*). Some commercial salad dressings(*). Peanut butter.   Avoid: Some commercial salad dressings(*).  Beverages  Allowed: Coffee (regular or decaffeinated), tea, herbal tea (read label to be sure that no wheat flour has been added). Carbonated beverages; some root  beers(*).   Avoid: Cereal beverages such as Postum or Ovaltine ; beer (unless Gluten free), ale, malted milk; some root beers, wine, and sake.  Condiments  Allowed: Salt, pepper, herbs, spices, extracts, food colorings; monosodium glutamate (MSG); cider, rice, and wine vinegar; bicarbonate of soda; baking powder; Chun King soy sauce; nuts, coconut, chocolate, and pure cocoa powder.   Avoid: Some curry powder (*), some dry seasoning mixes (*), some gravy extracts (*), some meat sauces (*), some catsup (*), some prepared mustard (*), horseradish (*), some soy sauce (*), chip dips (*), some chewing gum (*). Yeast extract (contains barley). Caramel color (may contain malt).  Flour and thickening agents Allowed: Arrowroot starch (A), Corn bran (B), Corn flour (B,C,D), Corn germ (B), Cornmeal (B,C,D), Corn starch (A), Potato flour (B,C,E), Potato starch flour (B,C,E), Rice bran (B), Rice flours: Plain, brown (B,C,D,E) Sweet (A,B,C,F). Rice polish (B,C,G), Soy flour (B,C,G), Tapioca starch (A). ARE GOOD FOR: (A) Good thickening agent (B) Good when combined with other flours (C) Best combined with milk and eggs in baked products (D) Best in grainy-textured products (E) Produces drier product than other flours (F) Produces moister product than other flours (G) Adds distinct flavor to product; use in moderation. (*) Check labels and investigate any questionable ingredients.  (-) Additional research is needed before this product can be recommended. (+) Check vegetable gum used. * These amounts indicate the minimum number of servings needed from the basic food groups to provide a variety of nutrients essential to good health. The word "maximum" is used if amounts of certain foods eaten must be controlled. Combination foods may count as full or partial servings from the food groups. Dark green, leafy, or orange vegetables are recommended 3 or 4 times weekly to provide vitamin A. A good source of vitamin C  is recommended daily.  NOTE: Brand names are used for clarification only, and do not constitute an endorsement. Also, ingredients may be changed by the manufacturer without notice. Always read labels. SAMPLE MEAL PLAN Breakfast   Fruit or juice.   Cereal (from allowed grains).   Toast (from allowed grains).   Heart-healthy tub margarine   Jam or jelly.   Milk beverage.  Lunch  Meat or meat substitute.   Gluten-free bread.   Vegetable or salad.   Heart-healthy margarine.   Fruit or dessert.   Milk beverage.  Dinner  Meat or meat substitute.   Potato or rice.   Salad with dressing or soup.   Gluten-free bread.   Vegetable.   Fruit or dessert.   Heart-healthy margarine.   Beverage.  SAMPLE MENU Breakfast   Orange juice.   Banana.   Rice or corn cereal.   Toast (gluten-free bread).   Heart-healthy tub margarine.   Jam.   Milk.   Coffee or tea.  Lunch  Chicken salad sandwich (with gluten-free bread and mayonnaise).   Sliced tomatoes.   Heart-healthy tub margarine.   Apple.   Milk.   Coffee or tea.  Dinner  [place].   Baked potato.   Broccoli.   Lettuce salad with gluten-free dressing.   Gluten-free bread.   Custard.   Heart-healthy margarine.   Coffee or tea.  These meal plans are provided as samples. Your daily meal plans will vary. Document Released: 12/16/2005 Document Revised: 08/28/2011 Document Reviewed: 05/01/2009 [facility] Patient Information 2012 [place], [place].

## 2012-02-17 NOTE — Progress Notes (Signed)
Subjective:    Patient ID: Rebecca Carroll, female    DOB: 14-Nov-1971, 41 y.o.   MRN: [id number]  HPI [name] is a 41 yo female with PMH of fibromyalgia, hypothyroidism, hypercholesterolemia, hypertension, and a new diagnosis of celiac disease who returns for followup.  I first saw Rebecca Carroll in late December 2012 for evaluation of abdominal pain with bloating along with altered bowel habits. The initial plan was for upper endoscopy and colonoscopy, but the colonoscopy prep was incomplete, and therefore on her procedure today the decision was made to proceed with EGD only.  This revealed abnormal duodenal mucosa with scalloping and biopsies were consistent with celiac disease.  Since this time she was started on a gluten-free diet and met with a nutritionist. She returns today feeling overall much better. She reports she is 80% better in total. Her bowel habits have normalized, her bloating is significantly better, and she very rarely has abdominal discomfort. No nausea or vomiting. No bad heartburn. No trouble swallowing. No blood in her stool. No melena. She does very occasionally have a "yucky" feeling in her stomach which lasts only a few hours, but she is unable to associate this with any specific diet change. She has made a dramatic dietary change and seems to be coping with this well. She is reading labels and has been very surprised to see how many foods containing gluten.  To this point she remains motivated to continue with the gluten-free diet.  No f/c/ns.  Review of Systems As per history of present illness otherwise negative  Past Medical History  Diagnosis Date  . Anxiety   . Hypertension   . High cholesterol   . GERD (gastroesophageal reflux disease)   . Fibromyalgia   . Migraine   . Hypothyroidism   . History of anemia   . Celiac disease    Current Outpatient Prescriptions  Medication Sig Dispense Refill  . DULoxetine (CYMBALTA) 60 MG capsule Take 60 mg by mouth daily.          Marland Kitchen ibuprofen (ADVIL,MOTRIN) 100 MG tablet Take 200 mg by mouth every 6 (six) hours as needed.      Marland Kitchen levothyroxine (SYNTHROID, LEVOTHROID) 25 MCG tablet Take 25 mcg by mouth daily.        Marland Kitchen losartan-hydrochlorothiazide (HYZAAR) 50-12.5 MG per tablet Take 1 tablet by mouth daily.        Marland Kitchen omeprazole (PRILOSEC) 20 MG capsule Take 20 mg by mouth daily.        . Probiotic Product (ALIGN) 4 MG CAPS Take 1 capsule by mouth daily.  30 capsule  0  . simvastatin (ZOCOR) 20 MG tablet Take 20 mg by mouth at bedtime.         No Known Allergies  Family History  Problem Relation Age of Onset  . Lung cancer Maternal Grandfather   . Diabetes Son   . Heart disease Maternal Grandmother   . Heart disease Paternal Grandmother   . Diverticulosis Father   . Hypertension Father   . Lung cancer Maternal Aunt   . Cirrhosis Paternal Uncle    SH - reviewed and no change. One of her sons was tested for celiac and negative, the other does not desire blood work at this time but is not having symptoms     Objective:   Physical Exam BP 138/96  Pulse 60  Ht 5\' 2"  (1.575 m)  Wt 155 lb 12.8 oz (70.67 kg)  BMI 28.50 kg/m2 Constitutional: Well-developed and  well-nourished. No distress. HEENT: Normocephalic and atraumatic. Oropharynx is clear and moist. No oropharyngeal exudate. Conjunctivae are normal. Pupils are equal round and reactive to light. No scleral icterus. Cardiovascular: Normal rate, regular rhythm and intact distal pulses. No M/R/G Pulmonary/chest: Effort normal and breath sounds normal. No wheezing, rales or rhonchi. Abdominal: Soft, nontender, nondistended. Bowel sounds active throughout. There are no masses palpable. No hepatosplenomegaly. Extremities: no clubbing, cyanosis, or edema Neurological: Alert and oriented to person place and time. Skin: Skin is warm and dry. No rashes noted. Psychiatric: Normal mood and affect. Behavior is normal.     Assessment & Plan:  41 yo female with PMH of  fibromyalgia, hypothyroidism, hypercholesterolemia, hypertension, and a new diagnosis of celiac disease who returns for followup  1. Celiac disease -- the patient has had a dramatic improvement in symptoms with gluten-free diet. She has been educated about which foods do not contain gluten. She's also considering attending a support group for people with celiac disease in [facility] [place].  She seems to be coping well with this lifestyle change. I've encouraged her to continue to gluten-free diet, and I made her aware that should she inadvertently gluten she will likely not slide back to square one, but I have encouraged her to be vigilant in trying to avoid gluten containing foods.  Her diagnosis was made by biopsy, but I will draw a TTG antibody today as I would like to follow this to normalization. Also will check a CBC and iron studies to ensure she is not iron deficient. If she is iron deficient, she may need supplementation, but this should normalize with improvement in her celiac disease.  I will see her back in 6 month's time or sooner if needed.

## 2012-02-18 LAB — TISSUE TRANSGLUTAMINASE, IGA: Tissue Transglutaminase Ab, IgA: 158 U/mL — ABNORMAL HIGH (ref <20)

## 2012-02-18 LAB — FERRITIN: Ferritin: 9.1 ng/mL — ABNORMAL LOW (ref 10.0–291.0)

## 2012-02-19 ENCOUNTER — Other Ambulatory Visit: Payer: Self-pay | Admitting: Internal Medicine

## 2012-02-19 MED ORDER — INTEGRA 62.5-62.5-40-3 MG PO CAPS: 1.0000 | ORAL_CAPSULE | Freq: Every day | ORAL | Status: DC

## 2012-04-02 ENCOUNTER — Other Ambulatory Visit: Payer: Self-pay | Admitting: Obstetrics and Gynecology

## 2012-04-24 ENCOUNTER — Other Ambulatory Visit: Payer: Self-pay | Admitting: Obstetrics and Gynecology

## 2012-04-24 DIAGNOSIS — N63 Unspecified lump in unspecified breast: Secondary | ICD-10-CM

## 2012-04-24 DIAGNOSIS — N644 Mastodynia: Secondary | ICD-10-CM

## 2012-04-29 ENCOUNTER — Ambulatory Visit
Admission: RE | Admit: 2012-04-29 | Discharge: 2012-04-29 | Disposition: A | Discharge status: Home / Self Care | Payer: 59 | Source: Ambulatory Visit | Attending: Obstetrics and Gynecology | Admitting: Obstetrics and Gynecology

## 2012-04-29 DIAGNOSIS — N63 Unspecified lump in unspecified breast: Secondary | ICD-10-CM

## 2012-04-29 DIAGNOSIS — N644 Mastodynia: Secondary | ICD-10-CM

## 2012-05-12 ENCOUNTER — Telehealth: Payer: Self-pay | Admitting: *Deleted

## 2012-05-12 DIAGNOSIS — D509 Iron deficiency anemia, unspecified: Secondary | ICD-10-CM

## 2012-05-12 NOTE — Telephone Encounter (Signed)
Message copied by Florene Glen on Tue May 12, 2012  3:14 PM ------      Message from: Florene Glen      Created: Wed Feb 19, 2012  1:00 PM       Call pt to schedule 3 month cbc and iron studies post integra use

## 2012-05-12 NOTE — Telephone Encounter (Signed)
Informed pt Dr Rhea Belton wanted to f/u with her after 3 months of Integra use. She will come in for labs prior to the visit on 05/18/12 at 3pm.

## 2012-05-14 ENCOUNTER — Encounter: Payer: Self-pay | Admitting: Internal Medicine

## 2012-05-18 ENCOUNTER — Ambulatory Visit: Payer: 59 | Admitting: Internal Medicine

## 2012-05-18 ENCOUNTER — Telehealth: Payer: Self-pay | Admitting: Internal Medicine

## 2012-05-19 ENCOUNTER — Other Ambulatory Visit (INDEPENDENT_AMBULATORY_CARE_PROVIDER_SITE_OTHER): Payer: 59

## 2012-05-19 DIAGNOSIS — D509 Iron deficiency anemia, unspecified: Secondary | ICD-10-CM

## 2012-05-19 LAB — CBC WITH DIFFERENTIAL/PLATELET
Basophils Absolute: 0 10*3/uL (ref 0.0–0.1)
Basophils Relative: 0.8 % (ref 0.0–3.0)
Eosinophils Absolute: 0.3 10*3/uL (ref 0.0–0.7)
Eosinophils Relative: 4.6 % (ref 0.0–5.0)
HCT: 35.5 % — ABNORMAL LOW (ref 36.0–46.0)
Hemoglobin: 12 g/dL (ref 12.0–15.0)
Lymphocytes Relative: 18.6 % (ref 12.0–46.0)
Lymphs Abs: 1.2 10*3/uL (ref 0.7–4.0)
MCHC: 33.9 g/dL (ref 30.0–36.0)
MCV: 88.6 fl (ref 78.0–100.0)
Monocytes Absolute: 0.5 10*3/uL (ref 0.1–1.0)
Monocytes Relative: 8 % (ref 3.0–12.0)
Neutro Abs: 4.3 10*3/uL (ref 1.4–7.7)
Neutrophils Relative %: 68 % (ref 43.0–77.0)
Platelets: 245 10*3/uL (ref 150.0–400.0)
RBC: 4.01 Mil/uL (ref 3.87–5.11)
RDW: 13 % (ref 11.5–14.6)
WBC: 6.4 10*3/uL (ref 4.5–10.5)

## 2012-05-19 LAB — IBC PANEL
Iron: 74 ug/dL (ref 42–145)
Saturation Ratios: 22.1 % (ref 20.0–50.0)
Transferrin: 239.7 mg/dL (ref 212.0–360.0)

## 2012-05-19 LAB — FERRITIN: Ferritin: 40.7 ng/mL (ref 10.0–291.0)

## 2012-05-20 ENCOUNTER — Ambulatory Visit: Payer: 59 | Admitting: Internal Medicine

## 2012-05-26 ENCOUNTER — Ambulatory Visit: Payer: 59 | Admitting: Internal Medicine

## 2012-05-26 ENCOUNTER — Telehealth: Payer: Self-pay | Admitting: *Deleted

## 2012-05-26 NOTE — Telephone Encounter (Signed)
Message copied by Florene Glen on Tue May 26, 2012 11:24 AM ------      Message from: Florene Glen      Created: Wed Feb 19, 2012  1:21 PM       Need repeat cbc and iron studies post integra start

## 2012-05-26 NOTE — Telephone Encounter (Signed)
Pt already had her labs drawn on 05/19/12.

## 2012-06-17 NOTE — Telephone Encounter (Signed)
Pt has cancelled last 3 appointments and has not made a follow up, Dr. Rhea Belton order blood work she still needs to have done.

## 2012-06-22 NOTE — Telephone Encounter (Signed)
This is an old note, no charge.  Reminder supposedly placed for return OV in Feb 2014

## 2012-08-15 IMAGING — NM NM HEPATO W/GB/PHARM/[PERSON_NAME]
2 series · 12 of 12 positions shown · non-contrast
Comparison: None.

CLINICAL DATA: Abdominal pain.

NUCLEAR MEDICINE HEPATOBILIARY IMAGING WITH GALLBLADDER EF
TECHNIQUE: Sequential images of the abdomen were obtained [DATE] minutes following intravenous administration of
radiopharmaceutical.  After slow intravenous infusion of
micrograms Cholecystokinin, gallbladder ejection fraction was
determined.
Radiopharmaceutical:  5.2 mCi 4c-BBm Choletec

[hida · 3.20mm/px · 6 of 60 frames shown (1 of 2)]
[frame 6/60]
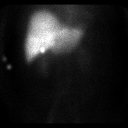
[frame 16/60]
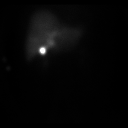
[frame 26/60]
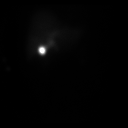
[frame 36/60]
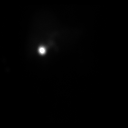
[frame 46/60]
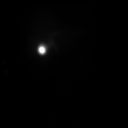
[frame 56/60]
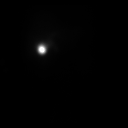

[hida · 3.20mm/px · 6 of 30 frames shown (2 of 2)]
[frame 3/30]
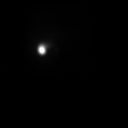
[frame 8/30]
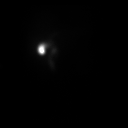
[frame 13/30]
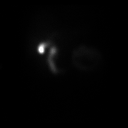
[frame 18/30]
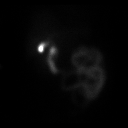
[frame 23/30]
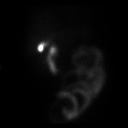
[frame 28/30]
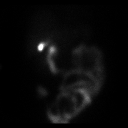

[12 of 12 positions shown; findings below may reference images not displayed]

FINDINGS: There is prompt and uniform uptake in the liver.
Gallbladder is initially visualized at 10 minutes.  Bowel activity
is seen at 15 minutes after CCK infusion.  Gallbladder ejection
fraction is 87% (normal is 30% or greater).

The patient did experience symptoms during CCK infusion.
IMPRESSION: 1.  No acute findings.
2.  Normal gallbladder ejection fraction.

## 2012-09-18 ENCOUNTER — Other Ambulatory Visit: Payer: Self-pay | Admitting: Family Medicine

## 2012-09-18 DIAGNOSIS — Z09 Encounter for follow-up examination after completed treatment for conditions other than malignant neoplasm: Secondary | ICD-10-CM

## 2012-09-18 DIAGNOSIS — Z139 Encounter for screening, unspecified: Secondary | ICD-10-CM

## 2012-09-30 ENCOUNTER — Ambulatory Visit (HOSPITAL_COMMUNITY)
Admission: RE | Admit: 2012-09-30 | Discharge: 2012-09-30 | Disposition: A | Discharge status: Home / Self Care | Payer: 59 | Source: Ambulatory Visit | Attending: Family Medicine | Admitting: Family Medicine

## 2012-09-30 DIAGNOSIS — R928 Other abnormal and inconclusive findings on diagnostic imaging of breast: Secondary | ICD-10-CM | POA: Insufficient documentation

## 2012-09-30 DIAGNOSIS — Z09 Encounter for follow-up examination after completed treatment for conditions other than malignant neoplasm: Secondary | ICD-10-CM

## 2012-12-31 ENCOUNTER — Ambulatory Visit (INDEPENDENT_AMBULATORY_CARE_PROVIDER_SITE_OTHER): Payer: 59 | Admitting: Otolaryngology

## 2012-12-31 DIAGNOSIS — J343 Hypertrophy of nasal turbinates: Secondary | ICD-10-CM

## 2012-12-31 DIAGNOSIS — R1312 Dysphagia, oropharyngeal phase: Secondary | ICD-10-CM

## 2012-12-31 DIAGNOSIS — J310 Chronic rhinitis: Secondary | ICD-10-CM

## 2012-12-31 DIAGNOSIS — J040 Acute laryngitis: Secondary | ICD-10-CM

## 2013-01-01 ENCOUNTER — Telehealth: Payer: Self-pay | Admitting: *Deleted

## 2013-01-01 NOTE — Telephone Encounter (Signed)
Pt scheduled for 02/02/13; pt stated understanding.

## 2013-01-01 NOTE — Telephone Encounter (Signed)
Message copied by Florene Glen on Fri Jan 01, 2013 11:09 AM ------      Message from: Daphine Deutscher      Created: Tue May 19, 2012  5:03 PM       Need to call and schedule one year f/u OV with Pyrtle(celiac f/u) in 01/2013.

## 2013-01-06 ENCOUNTER — Encounter (INDEPENDENT_AMBULATORY_CARE_PROVIDER_SITE_OTHER): Payer: 59 | Admitting: Ophthalmology

## 2013-01-06 DIAGNOSIS — H53009 Unspecified amblyopia, unspecified eye: Secondary | ICD-10-CM

## 2013-01-06 DIAGNOSIS — H538 Other visual disturbances: Secondary | ICD-10-CM

## 2013-01-06 DIAGNOSIS — H43819 Vitreous degeneration, unspecified eye: Secondary | ICD-10-CM

## 2013-01-06 DIAGNOSIS — H5340 Unspecified visual field defects: Secondary | ICD-10-CM

## 2013-01-26 ENCOUNTER — Other Ambulatory Visit: Payer: Self-pay | Admitting: Family Medicine

## 2013-01-26 DIAGNOSIS — G629 Polyneuropathy, unspecified: Secondary | ICD-10-CM

## 2013-01-27 ENCOUNTER — Encounter: Payer: Self-pay | Admitting: Internal Medicine

## 2013-01-28 ENCOUNTER — Ambulatory Visit (HOSPITAL_COMMUNITY)
Admission: RE | Admit: 2013-01-28 | Discharge: 2013-01-28 | Disposition: A | Discharge status: Home / Self Care | Payer: 59 | Source: Ambulatory Visit | Attending: Family Medicine | Admitting: Family Medicine

## 2013-01-28 DIAGNOSIS — G5690 Unspecified mononeuropathy of unspecified upper limb: Secondary | ICD-10-CM | POA: Insufficient documentation

## 2013-01-28 DIAGNOSIS — G629 Polyneuropathy, unspecified: Secondary | ICD-10-CM

## 2013-01-28 DIAGNOSIS — G5790 Unspecified mononeuropathy of unspecified lower limb: Secondary | ICD-10-CM | POA: Insufficient documentation

## 2013-01-28 DIAGNOSIS — H538 Other visual disturbances: Secondary | ICD-10-CM | POA: Insufficient documentation

## 2013-02-02 ENCOUNTER — Ambulatory Visit: Payer: 59 | Admitting: Internal Medicine

## 2013-02-13 ENCOUNTER — Other Ambulatory Visit: Payer: Self-pay

## 2013-02-20 ENCOUNTER — Emergency Department (HOSPITAL_COMMUNITY)
Admission: EM | Admit: 2013-02-20 | Discharge: 2013-02-20 | Disposition: A | Discharge status: Home / Self Care | Payer: 59 | Attending: Emergency Medicine | Admitting: Emergency Medicine

## 2013-02-20 ENCOUNTER — Encounter (HOSPITAL_COMMUNITY): Payer: Self-pay | Admitting: *Deleted

## 2013-02-20 ENCOUNTER — Emergency Department (HOSPITAL_COMMUNITY): Payer: 59

## 2013-02-20 DIAGNOSIS — K219 Gastro-esophageal reflux disease without esophagitis: Secondary | ICD-10-CM | POA: Insufficient documentation

## 2013-02-20 DIAGNOSIS — R51 Headache: Secondary | ICD-10-CM | POA: Insufficient documentation

## 2013-02-20 DIAGNOSIS — Z8669 Personal history of other diseases of the nervous system and sense organs: Secondary | ICD-10-CM | POA: Insufficient documentation

## 2013-02-20 DIAGNOSIS — B349 Viral infection, unspecified: Secondary | ICD-10-CM

## 2013-02-20 DIAGNOSIS — J3489 Other specified disorders of nose and nasal sinuses: Secondary | ICD-10-CM | POA: Insufficient documentation

## 2013-02-20 DIAGNOSIS — Z3202 Encounter for pregnancy test, result negative: Secondary | ICD-10-CM | POA: Insufficient documentation

## 2013-02-20 DIAGNOSIS — R52 Pain, unspecified: Secondary | ICD-10-CM | POA: Insufficient documentation

## 2013-02-20 DIAGNOSIS — B9789 Other viral agents as the cause of diseases classified elsewhere: Secondary | ICD-10-CM | POA: Insufficient documentation

## 2013-02-20 DIAGNOSIS — Z862 Personal history of diseases of the blood and blood-forming organs and certain disorders involving the immune mechanism: Secondary | ICD-10-CM | POA: Insufficient documentation

## 2013-02-20 DIAGNOSIS — J069 Acute upper respiratory infection, unspecified: Secondary | ICD-10-CM | POA: Insufficient documentation

## 2013-02-20 DIAGNOSIS — E7800 Pure hypercholesterolemia, unspecified: Secondary | ICD-10-CM | POA: Insufficient documentation

## 2013-02-20 DIAGNOSIS — R5381 Other malaise: Secondary | ICD-10-CM | POA: Insufficient documentation

## 2013-02-20 DIAGNOSIS — R5383 Other fatigue: Secondary | ICD-10-CM | POA: Insufficient documentation

## 2013-02-20 DIAGNOSIS — I10 Essential (primary) hypertension: Secondary | ICD-10-CM | POA: Insufficient documentation

## 2013-02-20 DIAGNOSIS — R059 Cough, unspecified: Secondary | ICD-10-CM | POA: Insufficient documentation

## 2013-02-20 DIAGNOSIS — Z79899 Other long term (current) drug therapy: Secondary | ICD-10-CM | POA: Insufficient documentation

## 2013-02-20 DIAGNOSIS — F411 Generalized anxiety disorder: Secondary | ICD-10-CM | POA: Insufficient documentation

## 2013-02-20 DIAGNOSIS — H938X9 Other specified disorders of ear, unspecified ear: Secondary | ICD-10-CM | POA: Insufficient documentation

## 2013-02-20 DIAGNOSIS — Z791 Long term (current) use of non-steroidal anti-inflammatories (NSAID): Secondary | ICD-10-CM | POA: Insufficient documentation

## 2013-02-20 DIAGNOSIS — R05 Cough: Secondary | ICD-10-CM | POA: Insufficient documentation

## 2013-02-20 DIAGNOSIS — J029 Acute pharyngitis, unspecified: Secondary | ICD-10-CM | POA: Insufficient documentation

## 2013-02-20 DIAGNOSIS — E039 Hypothyroidism, unspecified: Secondary | ICD-10-CM | POA: Insufficient documentation

## 2013-02-20 LAB — URINALYSIS, ROUTINE W REFLEX MICROSCOPIC
Bilirubin Urine: NEGATIVE
Glucose, UA: NEGATIVE mg/dL
Ketones, ur: NEGATIVE mg/dL
Leukocytes, UA: NEGATIVE
Nitrite: NEGATIVE
Protein, ur: NEGATIVE mg/dL
Specific Gravity, Urine: >1.03 — ABNORMAL HIGH (ref 1.005–1.030)
Urobilinogen, UA: 0.2 mg/dL (ref 0.0–1.0)
pH: 6 (ref 5.0–8.0)

## 2013-02-20 LAB — RAPID STREP SCREEN (MED CTR MEBANE ONLY): Streptococcus, Group A Screen (Direct): NEGATIVE

## 2013-02-20 LAB — URINE MICROSCOPIC-ADD ON

## 2013-02-20 LAB — PREGNANCY, URINE: Preg Test, Ur: NEGATIVE

## 2013-02-20 MED ORDER — BENZONATATE 100 MG PO CAPS: 100.0000 mg | ORAL_CAPSULE | Freq: Three times a day (TID) | ORAL | Status: DC | PRN

## 2013-02-20 MED ORDER — ACETAMINOPHEN 500 MG PO TABS
1000.0000 mg | ORAL_TABLET | Freq: Once | ORAL | Status: AC
  Administered 2013-02-20: 1000 mg via ORAL
  Filled 2013-02-20: qty 2

## 2013-02-20 MED ORDER — IBUPROFEN 400 MG PO TABS
400.0000 mg | ORAL_TABLET | Freq: Once | ORAL | Status: AC
  Administered 2013-02-20: 400 mg via ORAL
  Filled 2013-02-20: qty 1

## 2013-02-20 NOTE — ED Notes (Signed)
Patient given Ginger Ale per MD approval. 

## 2013-02-20 NOTE — ED Notes (Signed)
Pt c/o cough, congestion that started Wednesday, cough is non productive, fever n/v X2 that started yesterday. Headache, generalized body aches that started a few days.

## 2013-02-20 NOTE — ED Provider Notes (Addendum)
History     CSN: [id number]  Arrival date & time 02/20/13  8413   First MD Initiated Contact with Patient 02/20/13 [phone]      Chief Complaint  Patient presents with  . Fever  . Headache  . Cough     HPI Pt was seen at 0755.   Per pt, c/o gradual onset and persistence of constant sore throat, runny/stuffy nose, cough, generalized body aches/fatigue, home fevers to "102," sinus and ears congestion for the past 2-3 days.  States she had one episode of N/V yesterday.  Denies rash, no CP/SOB, no diarrhea, no abd pain.     Past Medical History  Diagnosis Date  . Anxiety   . Hypertension   . High cholesterol   . GERD (gastroesophageal reflux disease)   . Fibromyalgia   . Migraine   . Hypothyroidism   . History of anemia   . Celiac disease     Past Surgical History  Procedure Laterality Date  . Tonsillectomy    . Endometrial ablation      Family History  Problem Relation Age of Onset  . Lung cancer Maternal Grandfather   . Diabetes Son   . Heart disease Maternal Grandmother   . Heart disease Paternal Grandmother   . Diverticulosis Father   . Hypertension Father   . Lung cancer Maternal Aunt   . Cirrhosis Paternal Uncle     History  Substance Use Topics  . Smoking status: Never Smoker   . Smokeless tobacco: Never Used  . Alcohol Use: Yes     Comment: social    Review of Systems ROS: Statement: All systems negative except as marked or noted in the HPI; Constitutional:+fever and chills, generalized body aches/fatigue.; ; Eyes: Negative for eye pain, redness and discharge. ; ; ENMT: Negative for ear pain, hoarseness, +nasal and ears congestion, sinus pressure and sore throat. ; ; Cardiovascular: Negative for chest pain, palpitations, diaphoresis, dyspnea and peripheral edema. ; ; Respiratory: +cough. Negative for wheezing and stridor. ; ; Gastrointestinal: +N/V. Negative for diarrhea, abdominal pain, blood in stool, hematemesis, jaundice and rectal bleeding. . ; ;  Genitourinary: Negative for dysuria, flank pain and hematuria. ; ; Musculoskeletal: Negative for back pain and neck pain. Negative for swelling and trauma.; ; Skin: Negative for pruritus, rash, abrasions, blisters, bruising and skin lesion.; ; Neuro: Negative for headache, lightheadedness and neck stiffness. Negative for weakness, altered level of consciousness , altered mental status, extremity weakness, paresthesias, involuntary movement, seizure and syncope.       Allergies  Review of patient's allergies indicates no known allergies.  Home Medications   Current Outpatient Rx  Name  Route  Sig  Dispense  Refill  . DULoxetine (CYMBALTA) 60 MG capsule   Oral   Take 60 mg by mouth daily.           . Fe Fum-FePoly-Vit C-Vit B3 (INTEGRA) 62.5-62.5-40-3 MG CAPS   Oral   Take 1 capsule by mouth daily.   30 capsule   3   . ibuprofen (ADVIL,MOTRIN) 100 MG tablet   Oral   Take 200 mg by mouth every 6 (six) hours as needed.         Marland Kitchen levothyroxine (SYNTHROID, LEVOTHROID) 25 MCG tablet   Oral   Take 25 mcg by mouth daily.           Marland Kitchen losartan-hydrochlorothiazide (HYZAAR) 50-12.5 MG per tablet   Oral   Take 1 tablet by mouth daily.           Marland Kitchen  omeprazole (PRILOSEC) 20 MG capsule   Oral   Take 20 mg by mouth daily.           . Probiotic Product (ALIGN) 4 MG CAPS   Oral   Take 1 capsule by mouth daily.   30 capsule   0     Samples given to patient    Lot#                   ...   . simvastatin (ZOCOR) 20 MG tablet   Oral   Take 20 mg by mouth at bedtime.             BP 137/97  Pulse 103  Temp(Src) 98.7 F (37.1 C) (Oral)  Resp 20  Ht 5\' 2"  (1.575 m)  Wt 160 lb (72.576 kg)  BMI 29.26 kg/m2  SpO2 99%  Physical Exam 0800: Physical examination:  Nursing notes reviewed; Vital signs and O2 SAT reviewed;  Constitutional: Well developed, Well nourished, Well hydrated, In no acute distress; Head:  Normocephalic, atraumatic; Eyes: EOMI, PERRL, No scleral icterus;  ENMT: TM's clear bilat. +edemetous nasal turbinates bilat with clear rhinorrhea. Mouth and pharynx without lesions. No tonsillar exudates. No intra-oral edema. No hoarse voice, no drooling, no stridor. No pain with manipulation of larynx. Mouth and pharynx normal, Mucous membranes moist; Neck: Supple, Full range of motion, No lymphadenopathy; Cardiovascular: Regular rate and rhythm, No gallop; Respiratory: Breath sounds clear & equal bilaterally, No rales, rhonchi, wheezes.  Speaking full sentences with ease, Normal respiratory effort/excursion; Chest: Nontender, Movement normal; Abdomen: Soft, Nontender, Nondistended, Normal bowel sounds;; Extremities: Pulses normal, No tenderness, No edema, No calf edema or asymmetry.; Neuro: AA&Ox3, Major CN grossly intact.  Speech clear. Climbs on and off stretcher by herself. Gait steady. No gross focal motor or sensory deficits in extremities.; Skin: Color normal, Warm, Dry.   ED Course  Procedures      MDM  MDM Reviewed: previous chart, nursing note and vitals Interpretation: x-ray and labs   Results for orders placed during the hospital encounter of 02/20/13  RAPID STREP SCREEN      Result Value Range   Streptococcus, Group A Screen (Direct) NEGATIVE  NEGATIVE  URINALYSIS, ROUTINE W REFLEX MICROSCOPIC      Result Value Range   Color, Urine YELLOW  YELLOW   APPearance CLEAR  CLEAR   Specific Gravity, Urine >1.030 (*) 1.005 - 1.030   pH 6.0  5.0 - 8.0   Glucose, UA NEGATIVE  NEGATIVE mg/dL   Hgb urine dipstick MODERATE (*) NEGATIVE   Bilirubin Urine NEGATIVE  NEGATIVE   Ketones, ur NEGATIVE  NEGATIVE mg/dL   Protein, ur NEGATIVE  NEGATIVE mg/dL   Urobilinogen, UA 0.2  0.0 - 1.0 mg/dL   Nitrite NEGATIVE  NEGATIVE   Leukocytes, UA NEGATIVE  NEGATIVE  PREGNANCY, URINE      Result Value Range   Preg Test, Ur NEGATIVE  NEGATIVE  URINE MICROSCOPIC-ADD ON      Result Value Range   Squamous Epithelial / LPF MANY (*) RARE   WBC, UA 0-2  <3 WBC/hpf    RBC / HPF 3-6  <3 RBC/hpf   Dg Chest 2 View 02/20/2013  *RADIOLOGY REPORT*  Clinical Data: Cough and rule out infiltrate.  CHEST - 2 VIEW  Comparison: 10/09/2011  Findings: Two views of the chest were obtained.  Slightly prominent interstitial lung markings without focal disease.  Heart size is within normal limits and the trachea is midline.  IMPRESSION: No acute chest findings.   Original Report Authenticated By: Richarda Overlie, M.D.      Ozella.Henderson:  No acute findings on workup today.  Udip contaminated.  Has tol PO well while in the ED without N/V.  Will tx symptomatically. Dx and testing d/w pt and family.  Questions answered.  Verb understanding, agreeable to d/c home with outpt f/u.             Laray Anger, DO 02/21/13 [id number]

## 2013-02-25 ENCOUNTER — Ambulatory Visit (INDEPENDENT_AMBULATORY_CARE_PROVIDER_SITE_OTHER): Payer: 59 | Admitting: Otolaryngology

## 2013-03-04 ENCOUNTER — Ambulatory Visit (INDEPENDENT_AMBULATORY_CARE_PROVIDER_SITE_OTHER): Payer: 59 | Admitting: Otolaryngology

## 2013-03-24 ENCOUNTER — Telehealth: Payer: Self-pay | Admitting: Family Medicine

## 2013-03-24 ENCOUNTER — Other Ambulatory Visit: Payer: Self-pay | Admitting: *Deleted

## 2013-03-24 DIAGNOSIS — E039 Hypothyroidism, unspecified: Secondary | ICD-10-CM

## 2013-03-24 DIAGNOSIS — E559 Vitamin D deficiency, unspecified: Secondary | ICD-10-CM

## 2013-03-24 NOTE — Telephone Encounter (Signed)
Blood work papers for her vit d and thyroid, liver profile and what ever else you think she needs

## 2013-03-24 NOTE — Telephone Encounter (Signed)
Orders in epic. 

## 2013-03-24 NOTE — Telephone Encounter (Signed)
i reviewed chart. At this point lipid not needed. Go with TSH/free T4/ vit d for Dx vit d def and hypothyroidism. Thanks

## 2013-03-26 ENCOUNTER — Encounter: Payer: Self-pay | Admitting: Family Medicine

## 2013-03-26 ENCOUNTER — Ambulatory Visit (INDEPENDENT_AMBULATORY_CARE_PROVIDER_SITE_OTHER): Payer: 59 | Admitting: Family Medicine

## 2013-03-26 VITALS — BP 120/88 | Temp 97.8°F | Ht 62.0 in | Wt 164.0 lb

## 2013-03-26 DIAGNOSIS — IMO0001 Reserved for inherently not codable concepts without codable children: Secondary | ICD-10-CM

## 2013-03-26 DIAGNOSIS — H53029 Refractive amblyopia, unspecified eye: Secondary | ICD-10-CM | POA: Insufficient documentation

## 2013-03-26 DIAGNOSIS — M797 Fibromyalgia: Secondary | ICD-10-CM

## 2013-03-26 DIAGNOSIS — F411 Generalized anxiety disorder: Secondary | ICD-10-CM

## 2013-03-26 DIAGNOSIS — H539 Unspecified visual disturbance: Secondary | ICD-10-CM | POA: Insufficient documentation

## 2013-03-26 DIAGNOSIS — IMO0002 Reserved for concepts with insufficient information to code with codable children: Secondary | ICD-10-CM | POA: Insufficient documentation

## 2013-03-26 MED ORDER — ALPRAZOLAM 0.5 MG PO TABS: 0.5000 mg | ORAL_TABLET | Freq: Three times a day (TID) | ORAL | Status: DC | PRN

## 2013-03-26 NOTE — Patient Instructions (Signed)
Follow up as needed

## 2013-03-26 NOTE — Progress Notes (Signed)
  Subjective:    Patient ID: Rebecca Carroll, female    DOB: 02/27/1971, 42 y.o.   MRN: [id number]  Anxiety Presents for initial visit. Onset was in the past 7 days. The problem has been waxing and waning. Symptoms include insomnia, irritability, nervous/anxious behavior, panic and restlessness. Patient reports no excessive worry, hyperventilation or obsessions. Symptoms occur most days. The severity of symptoms is moderate. Exacerbated by: triggered by when she thinks about the health issues she is going through. The quality of sleep is fair. Nighttime awakenings: occasional.   There are no known risk factors.   Patient is stressed out regarding recent diagnosis with her eye and potential for loss of vision   Review of Systems  Constitutional: Positive for fever, irritability and fatigue. Negative for activity change and appetite change.  Eyes: Positive for visual disturbance (followed by eye specialist).  Respiratory: Negative.   Cardiovascular: Negative.   Neurological: Negative.   Psychiatric/Behavioral: Negative for agitation. The patient is nervous/anxious and has insomnia.        Objective:   Physical Exam  Limited physical exam was done vital signs stable patient tearful about the episodes she is going through. She also has fibromyalgia and this appears to be stable. Extremities no edema.      Assessment & Plan:  Fibromyalgia  Anxiety state, unspecified

## 2013-04-07 ENCOUNTER — Ambulatory Visit (INDEPENDENT_AMBULATORY_CARE_PROVIDER_SITE_OTHER): Payer: 59 | Admitting: Nurse Practitioner

## 2013-04-07 ENCOUNTER — Encounter: Payer: Self-pay | Admitting: Nurse Practitioner

## 2013-04-07 VITALS — BP 120/78 | Temp 98.3°F | Ht 62.0 in | Wt 163.2 lb

## 2013-04-07 DIAGNOSIS — H1045 Other chronic allergic conjunctivitis: Secondary | ICD-10-CM

## 2013-04-07 DIAGNOSIS — H1010 Acute atopic conjunctivitis, unspecified eye: Secondary | ICD-10-CM | POA: Insufficient documentation

## 2013-04-07 DIAGNOSIS — H1013 Acute atopic conjunctivitis, bilateral: Secondary | ICD-10-CM

## 2013-04-07 DIAGNOSIS — J309 Allergic rhinitis, unspecified: Secondary | ICD-10-CM

## 2013-04-07 MED ORDER — PHENTERMINE HCL 37.5 MG PO CAPS: 37.5000 mg | ORAL_CAPSULE | ORAL | Status: DC

## 2013-04-07 MED ORDER — METHYLPREDNISOLONE ACETATE 40 MG/ML IJ SUSP
40.0000 mg | Freq: Once | INTRAMUSCULAR | Status: AC
  Administered 2013-04-07: 40 mg via INTRAMUSCULAR

## 2013-04-07 NOTE — Assessment & Plan Note (Signed)
Depo-Medrol 40 mg IM today. Start Nasacort AQ and Allegra OTC as directed. Avoid excessive exposure to pollen as much as possible. Call back if worsens or persists.

## 2013-04-07 NOTE — Progress Notes (Signed)
Subjective:  Presents for complaints of sneezing congestion for the past 4 days. Itchy watery eyes. Clear nasal drainage. Sinus pressure more so on the right side. Minimal cough. No sore throat. Ear pressure. Also wants to restart medication for weight loss. Has taken phentermine without difficulty in the past.  Objective:   BP 120/78  Temp(Src) 98.3 F (36.8 C) (Oral)  Ht 5\' 2"  (1.575 m)  Wt 163 lb 4 oz (74.05 kg)  BMI 29.85 kg/m2 NAD. Alert, oriented. Right TM retracted, no erythema. Left TM minimal clear effusion. Pharynx minimally injected. Neck supple with mild soft adenopathy. Lungs clear. Heart regular rate rhythm. Conjunctiva mildly injected. Allergic shiners noted.

## 2013-04-07 NOTE — Patient Instructions (Signed)
Allegra (generic) as directed [email or web address] Nasacort AQ as directed. Zaditor (generic) eyedrops as directed.

## 2013-04-07 NOTE — Assessment & Plan Note (Signed)
OTC Zaditor eyedrops as directed.

## 2013-04-19 ENCOUNTER — Other Ambulatory Visit: Payer: Self-pay | Admitting: Internal Medicine

## 2013-04-19 NOTE — Telephone Encounter (Signed)
Pt may not need iron supplementation now that celiac disease is treated Would repeat iron, ferritin, % sat before refilling iron RX Thanks

## 2013-04-23 ENCOUNTER — Other Ambulatory Visit: Payer: Self-pay | Admitting: *Deleted

## 2013-04-23 DIAGNOSIS — E039 Hypothyroidism, unspecified: Secondary | ICD-10-CM

## 2013-04-23 DIAGNOSIS — E559 Vitamin D deficiency, unspecified: Secondary | ICD-10-CM

## 2013-04-23 LAB — TSH: TSH: 2.594 u[IU]/mL (ref 0.350–4.500)

## 2013-04-23 LAB — T4, FREE: Free T4: 1.04 ng/dL (ref 0.80–1.80)

## 2013-04-24 LAB — VITAMIN D 25 HYDROXY (VIT D DEFICIENCY, FRACTURES): Vit D, 25-Hydroxy: 33 ng/mL (ref 30–89)

## 2013-04-26 NOTE — Progress Notes (Signed)
Pt aware of results 

## 2013-04-27 ENCOUNTER — Ambulatory Visit (INDEPENDENT_AMBULATORY_CARE_PROVIDER_SITE_OTHER): Payer: 59 | Admitting: Family Medicine

## 2013-04-27 ENCOUNTER — Encounter: Payer: Self-pay | Admitting: Family Medicine

## 2013-04-27 VITALS — BP 112/78 | Temp 98.3°F | Ht 62.0 in | Wt 162.5 lb

## 2013-04-27 DIAGNOSIS — J019 Acute sinusitis, unspecified: Secondary | ICD-10-CM

## 2013-04-27 DIAGNOSIS — J309 Allergic rhinitis, unspecified: Secondary | ICD-10-CM

## 2013-04-27 MED ORDER — METHYLPREDNISOLONE ACETATE 40 MG/ML IJ SUSP
40.0000 mg | Freq: Once | INTRAMUSCULAR | Status: AC
  Administered 2013-04-27: 40 mg via INTRAMUSCULAR

## 2013-04-27 MED ORDER — LEVOFLOXACIN 500 MG PO TABS: 500.0000 mg | ORAL_TABLET | Freq: Every day | ORAL | Status: DC

## 2013-04-27 NOTE — Progress Notes (Signed)
  Subjective:    Patient ID: Rebecca Carroll, female    DOB: 1971/10/15, 42 y.o.   MRN: [id number]  HPI Trying zrytec with minimal help. Lungs have not had any wheezing according to the patient. She has had moderate sinus pressure and tenderness. Also here or discomfort. Complains of soreness in the colon and some on the neck. Has had problems with allergies in the past.   Review of Systemsplease see above. No shortness of breath vomiting diarrhea or fevers.     Objective:   Physical Exam Vital signs stable. Eardrums normal throat is normal neck is supple minimal adenopathy lungs are clear hearts regular skin warm dry       Assessment & Plan:  Allergic rhinitis-Depo-Medrol 40 mg IM, cetirizine daily or may use Allegra. Levaquin one daily for the next 10 days if progressive symptoms followup. Acute sinusitis-Levaquin as above

## 2013-05-05 ENCOUNTER — Telehealth: Payer: Self-pay | Admitting: Family Medicine

## 2013-05-05 NOTE — Telephone Encounter (Signed)
Ok thanks 

## 2013-05-05 NOTE — Telephone Encounter (Signed)
Fluticasone Propionate Nasal spray 50 mcg is what Dr. Suszanne Conners prescribed patient

## 2013-05-18 ENCOUNTER — Other Ambulatory Visit: Payer: Self-pay | Admitting: Family Medicine

## 2013-05-18 NOTE — Telephone Encounter (Signed)
May refill xanax 3 total Refill levaquin times one

## 2013-05-18 NOTE — Telephone Encounter (Signed)
Meds called in to [facility] pharmacy

## 2013-05-19 MED ORDER — LEVOFLOXACIN 500 MG PO TABS: 500.0000 mg | ORAL_TABLET | Freq: Every day | ORAL | Status: DC

## 2013-05-19 MED ORDER — ALPRAZOLAM 0.5 MG PO TABS: 0.5000 mg | ORAL_TABLET | Freq: Three times a day (TID) | ORAL | Status: DC | PRN

## 2013-05-19 NOTE — Telephone Encounter (Signed)
RXS called in on pharmacy voicemail.

## 2013-05-31 ENCOUNTER — Ambulatory Visit (INDEPENDENT_AMBULATORY_CARE_PROVIDER_SITE_OTHER): Payer: 59 | Admitting: Neurology

## 2013-05-31 ENCOUNTER — Encounter: Payer: Self-pay | Admitting: Neurology

## 2013-05-31 VITALS — BP 134/87 | HR 97 | Temp 97.3°F | Ht 63.0 in | Wt 160.0 lb

## 2013-05-31 DIAGNOSIS — R5381 Other malaise: Secondary | ICD-10-CM

## 2013-05-31 DIAGNOSIS — R531 Weakness: Secondary | ICD-10-CM

## 2013-05-31 DIAGNOSIS — R209 Unspecified disturbances of skin sensation: Secondary | ICD-10-CM

## 2013-05-31 DIAGNOSIS — G4733 Obstructive sleep apnea (adult) (pediatric): Secondary | ICD-10-CM

## 2013-05-31 DIAGNOSIS — R5383 Other fatigue: Secondary | ICD-10-CM

## 2013-05-31 DIAGNOSIS — R202 Paresthesia of skin: Secondary | ICD-10-CM

## 2013-05-31 DIAGNOSIS — M79609 Pain in unspecified limb: Secondary | ICD-10-CM

## 2013-05-31 NOTE — Progress Notes (Signed)
Subjective:    Patient ID: Rebecca Carroll is a 42 y.o. female.  HPI  Huston Foley, MD, PhD [facility] [address], [address] [address] [place], [place] [address]  Dear Dr. Gerda Diss,   I saw your patient, Rebecca Carroll, Upon your kind request in my neurologic clinic today for initial consultation of her paresthesias and blurry vision. The patient is unaccompanied today. As you know, Rebecca Carroll is a very pleasant 42 year old right-handed woman with an underlying medical history of hyperlipidemia, hypertension, fibromyalgia, reflux disease, hypothyroidism and celiac disease who has been experiencing burning sensation and tingling in her legs, feet and hands for the past several months. Sx are worse when she is upset and there is increase in leg temperature and redness at times. Her current medications are methotrexate, Cymbalta, potassium, omeprazole, levothyroxine, Hyzaar, Norvasc, Zocor, Phentermine. She had stopped her methotrexate for a couple of months and it made no considerable difference. She is on it for scalp psoriasis. She has noted swelling in her distal legs, usually at the end of the day. She has noted some heaviness in her legs, perhaps weakness. She has not fallen. No weakness in her arms. For her blurry vision she has seen an eye doctor in [facility]. 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 She has no ptosis or diplopia.  She tried neurontin, but felt sedated and it did not help.  She works full time as a Scientist, physiological at [employer].   She has also been experiencing joint pain and muscle aches and pains. This is primarily in her thigh muscles and knee area. She had a brain MRI without contrast on 01/28/2013 which was reported as negative MRI of the brain. She also had blood work recently in January which I reviewed. She had a mildly elevated ESR at 24, potassium  was low at 3.1, cholesterol was elevated and LDL was 147, TSH was elevated at 5.7.  Of note, she feels tired during the day and her ESS is high at 17/24 today. She is known to snore heavily and has noted choking sensations while asleep. She has been told she has breathing pauses while asleep, as witnessed by her husband. She has never had a sleep study.  Her Past Medical History Is Significant For: Past Medical History  Diagnosis Date  . Anxiety   . Hypertension   . High cholesterol   . GERD (gastroesophageal reflux disease)   . Fibromyalgia   . Migraine   . Hypothyroidism   . History of anemia   . Celiac disease   . Migraine   . Fibromyalgia   . Scalp psoriasis     Her Past Surgical History Is Significant For: Past Surgical History  Procedure Laterality Date  . Tonsillectomy    . Endometrial ablation    . Esophagogastroduodenoscopy      Her Family History Is Significant For: Family History  Problem Relation Age of Onset  . Lung cancer Maternal Grandfather   . Diabetes Son   . Heart disease Maternal Grandmother   . Heart disease Paternal Grandmother   . Diverticulosis Father   . Hypertension Father   . Lung cancer Maternal Aunt   . Cirrhosis Paternal Uncle     Her Social History Is Significant For: History   Social History  . Marital Status: Married    Spouse Name: N/A  Number of Children: 2  . Years of Education: N/A   Occupational History  . nurse sec    Social History Main Topics  . Smoking status: Never Smoker   . Smokeless tobacco: Never Used  . Alcohol Use: Yes     Comment: social  . Drug Use: No  . Sexually Active: None   Other Topics Concern  . None   Social History Narrative  . None   Her Allergies Are:  No Known Allergies:   Her Current Medications Are:  Outpatient Encounter Prescriptions as of 05/31/2013  Medication Sig Dispense Refill  . ALPRAZolam (XANAX) 0.5 MG tablet Take 1 tablet (0.5 mg total) by mouth 3 (three)  times daily as needed for sleep or anxiety (use half tablet during the day .Marland Kitchen..half or whole at bedtime prn).  30 tablet  0  . amLODipine (NORVASC) 5 MG tablet Take 5 mg by mouth daily.      . DULoxetine (CYMBALTA) 60 MG capsule Take 60 mg by mouth daily.        . Fe Fum-FePoly-Vit C-Vit B3 (INTEGRA) 62.5-62.5-40-3 MG CAPS TAKE 1 CAPSULE BY MOUTH DAILY.  30 capsule  3  . losartan-hydrochlorothiazide (HYZAAR) 50-12.5 MG per tablet Take 1 tablet by mouth daily.       . Methotrexate Sodium (METHOTREXATE PO) Take 10 tablets by mouth once a week.      Marland Kitchen omeprazole (PRILOSEC) 20 MG capsule Take 20 mg by mouth daily as needed (indigestion).      . phentermine 37.5 MG capsule Take 1 capsule (37.5 mg total) by mouth every morning.  30 capsule  2  . Vitamin D, Ergocalciferol, (DRISDOL) 50000 UNITS CAPS Take 50,000 Units by mouth every 7 (seven) days.      . [DISCONTINUED] levofloxacin (LEVAQUIN) 500 MG tablet Take 1 tablet (500 mg total) by mouth daily.  10 tablet  0   No facility-administered encounter medications on file as of 05/31/2013.   Review of Systems  Constitutional: Positive for fatigue.  Eyes: Positive for visual disturbance (blurred).  Respiratory: Positive for shortness of breath.        Snoring  Cardiovascular: Positive for leg swelling.  Musculoskeletal: Positive for myalgias.  Allergic/Immunologic: Positive for environmental allergies.  Neurological:       Restless leg  Psychiatric/Behavioral: Positive for sleep disturbance (Insomnia).    Objective:  Neurologic Exam  Physical Exam Physical Examination:   Filed Vitals:   05/31/13 0840  BP: 134/87  Pulse: 97  Temp: 97.3 F (36.3 C)    General Examination: The patient is a very pleasant 42 y.o. female in no acute distress. She appears well-developed and well-nourished and well groomed.   HEENT: Normocephalic, atraumatic, pupils are equal, round and reactive to light and accommodation. Funduscopic exam is normal with sharp  disc margins noted. Extraocular tracking is good without limitation to gaze excursion or nystagmus noted. Normal smooth pursuit is noted. Hearing is grossly intact. Tympanic membranes are clear bilaterally. Face is symmetric with normal facial animation and normal facial sensation. Speech is clear with no dysarthria noted. There is no hypophonia. There is no lip, neck/head, jaw or voice tremor. Neck is supple with full range of passive and active motion. There are no carotid bruits on auscultation. Oropharynx exam reveals: mild mouth dryness, good dental hygiene and moderate airway crowding, due to larger tongue, narrow airway and redundant soft palate. Mallampati is class III. Tongue protrudes centrally and palate elevates symmetrically. Neck size is 14.5 inches.  Chest: Clear to auscultation without wheezing, rhonchi or crackles noted.  Heart: S1+S2+0, regular and normal without murmurs, rubs or gallops noted.   Abdomen: Soft, non-tender and non-distended with normal bowel sounds appreciated on auscultation.  Extremities: There is no pitting edema in the distal lower extremities bilaterally. Pedal pulses are intact.  Skin: Warm and dry without trophic changes noted. There are no varicose veins.  Musculoskeletal: exam reveals no obvious joint deformities, tenderness or joint swelling or erythema.   Neurologically:  Mental status: The patient is awake, alert and oriented in all 4 spheres. Her memory, attention, language and knowledge are appropriate. There is no aphasia, agnosia, apraxia or anomia. Speech is clear with normal prosody and enunciation. Thought process is linear. Mood is congruent and affect is normal.  Cranial nerves are as described above under HEENT exam. In addition, shoulder shrug is normal with equal shoulder height noted. Motor exam: Normal bulk, strength and tone is noted. There is no drift, tremor or rebound. Romberg is negative. Reflexes are 2+ throughout. Toes are downgoing  bilaterally. Fine motor skills are intact with normal finger taps, normal hand movements, normal rapid alternating patting, normal foot taps and normal foot agility.  Cerebellar testing shows no dysmetria or intention tremor on finger to nose testing. Heel to shin is unremarkable bilaterally. There is no truncal or gait ataxia.  Sensory exam is intact to light touch, pinprick, vibration, temperature sense and proprioception in the upper and lower extremities.  Gait, station and balance are unremarkable. No veering to one side is noted. No leaning to one side is noted. Posture is age-appropriate and stance is narrow based. No problems turning are noted. She turns en bloc. Tandem walk is unremarkable. Intact toe and heel stance is noted.               Assessment and Plan:   Assessment and Plan:  In summary, Rebecca Carroll is a very pleasant 42 y.o.-year old female with a history of paresthesias, burning pain, and increase in temperature with redness reported in her legs and arm/hands. Her physical exam is non-focal with the exception of a moderately crowded airway and obesity. I do believe she has some degree of OSA and I talked to her about the risks and ramifications of OSA as well. She understands the cardiovascular implications of untreated obstructive sleep apnea. I explained to her that some of her vague symptoms such as tiredness, the subjective weakness and her lower chimney swelling may in fact be tied in with untreated OSA and I would encourage her to get treated with CPAP for that if she has underlying evidence of OSA. Today I suggested further workup in the form of a sleep study, EMG and nerve conduction testing, additional blood work to look for muscle enzymes and other inflammatory markers. I will see her back in 3 months from now, sooner if the need arises. I answered all her questions today and encouraged her to call with any other questions, concerns, problems or updates. She was in agreement.    Thank you very much for allowing me to participate in the care of this nice patient. If I can be of any further assistance to you please do not hesitate to call me at [phone].  Sincerely,   Huston Foley, MD, PhD

## 2013-05-31 NOTE — Patient Instructions (Addendum)
I think overall you are doing fairly well but I do want to suggest a few things today:  Remember to drink plenty of fluid, eat healthy meals and do not skip any meals. Try to eat protein with a every meal and eat a healthy snack such as fruit or nuts in between meals. Try to keep a regular sleep-wake schedule and try to exercise daily, particularly in the form of walking, 20-30 minutes a day, if you can.   As far as your medications are concerned, I would like to suggest no changes.   As far as diagnostic testing: EMG/NCV, blood work, sleep study.   Based on your symptoms and your exam I believe you are at risk for obstructive sleep apnea or OSA, and I think we should proceed with a sleep study to determine whether you do or do not have OSA and how severe it is. If you have more than mild OSA, I want you to consider treatment with CPAP. Please remember, the risks and ramifications of moderate to severe obstructive sleep apnea or OSA are: Cardiovascular disease, including congestive heart failure, stroke, difficult to control hypertension, arrhythmias, and even type 2 diabetes has been linked to untreated OSA. Sleep apnea causes disruption of sleep and sleep deprivation in most cases, which, in turn, can cause recurrent headaches, problems with memory, mood, concentration, focus, and vigilance. Most people with untreated sleep apnea report excessive daytime sleepiness, which can affect their ability to drive. Please do not drive if you feel sleepy.  I will see you back after your sleep study to go over the test results and where to go from there. We will call you after your sleep study and to set up an appointment at the time.   I would like to see you back in 3 months, sooner if we need to. Please call us with any interim questions, concerns, problems, updates or refill requests.  Please also call us for any test results so we can go over those with you on the phone. Brett Canales is my clinical assistant and  will answer any of your questions and relay your messages to me and also relay most of my messages to you.  Our phone number is [phone]. We also have an after hours call service for urgent matters and there is a physician on-call for urgent questions. For any emergencies you know to call 911 or go to the nearest emergency room.

## 2013-06-02 LAB — B12 AND FOLATE PANEL
Folate: 6.3 ng/mL (ref >3.0)
Vitamin B-12: 764 pg/mL (ref 211–946)

## 2013-06-02 LAB — HGB A1C W/O EAG: Hgb A1c MFr Bld: 5.6 % (ref 4.8–5.6)

## 2013-06-02 LAB — ALDOLASE: Aldolase: 5.8 U/L (ref 1.2–7.6)

## 2013-06-02 LAB — CK TOTAL AND CKMB (NOT AT ARMC)
CK-MB Index: 2.7 ng/mL (ref 0.0–2.9)
Total CK: 129 U/L (ref 24–173)

## 2013-06-02 LAB — VITAMIN E: Vitamin E (Alpha Tocopherol): 7.8 mg/L (ref 4.6–17.8)

## 2013-06-02 LAB — RPR: RPR: NONREACTIVE

## 2013-06-02 LAB — C-REACTIVE PROTEIN: CRP: 4.2 mg/L (ref 0.0–4.9)

## 2013-06-02 NOTE — Progress Notes (Signed)
Quick Note:  Please call and advise the patient that the recent labs we checked were within normal limits. We checked vitamin B12 level, folate, cell count, blood sugar, diabetes marker, electrolytes, kidney function, liver function, thyroid function, inflammatory markers, muscle enzymes. No further action is required on these tests at this time. Please remind patient to keep any upcoming appointments or tests and to call us with any interim questions, concerns, problems or updates. Thanks,  Huston Foley, MD, PhD    ______

## 2013-06-03 NOTE — Progress Notes (Signed)
Quick Note:  Left message for patient with normal lab results, per Dr. Frances Furbish. Told to call with any questions. ______

## 2013-06-09 ENCOUNTER — Encounter: Payer: 59 | Admitting: Neurology

## 2013-06-09 ENCOUNTER — Encounter: Payer: 59 | Admitting: Radiology

## 2013-06-10 ENCOUNTER — Encounter: Payer: 59 | Admitting: Neurology

## 2013-06-11 ENCOUNTER — Ambulatory Visit (INDEPENDENT_AMBULATORY_CARE_PROVIDER_SITE_OTHER): Payer: 59 | Admitting: Neurology

## 2013-06-11 VITALS — BP 120/78

## 2013-06-11 DIAGNOSIS — G4733 Obstructive sleep apnea (adult) (pediatric): Secondary | ICD-10-CM

## 2013-06-15 ENCOUNTER — Encounter: Payer: Self-pay | Admitting: Family Medicine

## 2013-06-15 DIAGNOSIS — E876 Hypokalemia: Secondary | ICD-10-CM

## 2013-06-15 DIAGNOSIS — Z79899 Other long term (current) drug therapy: Secondary | ICD-10-CM

## 2013-06-15 DIAGNOSIS — E785 Hyperlipidemia, unspecified: Secondary | ICD-10-CM

## 2013-06-16 ENCOUNTER — Other Ambulatory Visit: Payer: Self-pay | Admitting: Family Medicine

## 2013-06-16 DIAGNOSIS — E876 Hypokalemia: Secondary | ICD-10-CM

## 2013-06-16 DIAGNOSIS — Z79899 Other long term (current) drug therapy: Secondary | ICD-10-CM

## 2013-06-16 DIAGNOSIS — E785 Hyperlipidemia, unspecified: Secondary | ICD-10-CM

## 2013-06-21 ENCOUNTER — Telehealth: Payer: Self-pay | Admitting: Neurology

## 2013-06-21 NOTE — Telephone Encounter (Signed)
Please call and notify the patient that the recent sleep study did not show any significant obstructive sleep apnea. Her snoring was worse when sleeping supine. She had mild obstructive sleep disordered breathing when sleeping on her back. Her Oxygen saturation was 92% at baseline when asleep. Sleeping off her back and weight loss may help all of this. Please inform patient that I would like to go over the details of the study during a follow up appointment. She should have a FU appt with me already scheduled. Also, route or fax report to PCP and referring MD, if other than PCP.  Once you have spoken to patient, you can close this encounter.   Thanks,  Huston Foley, MD, PhD [facility])

## 2013-06-24 ENCOUNTER — Ambulatory Visit (INDEPENDENT_AMBULATORY_CARE_PROVIDER_SITE_OTHER): Payer: 59 | Admitting: Family Medicine

## 2013-06-24 ENCOUNTER — Encounter: Payer: Self-pay | Admitting: Family Medicine

## 2013-06-24 VITALS — BP 116/78 | Temp 98.3°F | Ht 62.0 in | Wt 163.4 lb

## 2013-06-24 DIAGNOSIS — T148 Other injury of unspecified body region: Secondary | ICD-10-CM

## 2013-06-24 DIAGNOSIS — W57XXXA Bitten or stung by nonvenomous insect and other nonvenomous arthropods, initial encounter: Secondary | ICD-10-CM

## 2013-06-24 MED ORDER — DOXYCYCLINE HYCLATE 100 MG PO CAPS: 100.0000 mg | ORAL_CAPSULE | Freq: Two times a day (BID) | ORAL | Status: DC

## 2013-06-24 NOTE — Progress Notes (Signed)
  Subjective:    Patient ID: Rebecca Carroll, female    DOB: 09/08/1971, 42 y.o.   MRN: [id number]  HPI Patient is here today because she has a possible tick bite on the back of her left leg. Just noticed area today. Redness and slight swelling noted at the site.  Patient relates her erythema she thinks the head of the tick is still present.  Review of Systems No fevers vomiting or severe muscle aches    Objective:   Physical Exam  The area was prepped with rubbing alcohol lidocaine with epinephrine injected upon consent. What appears to be a small tic head was removed      Assessment & Plan:  Tick bite-there is urine femur around it doxycycline was sent in. If fevers headache muscle aches start medicine twice daily avoid excessive sun.

## 2013-06-24 NOTE — Patient Instructions (Signed)
Wood Tick Bite Ticks are insects that attach themselves to the skin. Most tick bites are harmless, but sometimes ticks carry diseases that can make a person quite ill. The chance of getting ill depends on:  The kind of tick that bites you.  Time of year.  How long the tick is attached.  Geographic location. Wood ticks are also called dog ticks. They are generally black. They can have white markings. They live in shrubs and grassy areas. They are larger than deer ticks. Wood ticks are about the size of a watermelon seed. They have a hard body. The most common places for ticks to attach themselves are the scalp, neck, armpits, waist, and groin. Wood ticks may stay attached for up to 2 weeks. TICKS MUST BE REMOVED AS SOON AS POSSIBLE TO HELP PREVENT DISEASES CAUSED BY TICK BITES.  TO REMOVE A TICK: 1. If available, put on latex gloves before trying to remove a tick. 2. Grasp the tick as close to the skin as possible, with curved forceps, fine tweezers or a special tick removal tool. 3. Pull gently with steady pressure until the tick lets go. Do not twist the tick or jerk it suddenly. This may break off the tick's head or mouth parts. 4. Do not crush the tick's body. This could force disease-carrying fluids from the tick into your body. 5. After the tick is removed, wash the bite area and your hands with soap and water or other disinfectant. 6. Apply a small amount of antiseptic cream or ointment to the bite site. 7. Wash and disinfect any instruments that were used. 8. Save the tick in a jar or plastic bag for later identification. Preserve the tick with a bit of alcohol or put it in the freezer. 9. Do not apply a hot match, petroleum jelly, or fingernail polish to the tick. This does not work and may increase the chances of disease from the tick bite. YOU MAY NEED TO SEE YOUR CAREGIVER FOR A TETANUS SHOT NOW IF:  You have no idea when you had the last one.  You have never had a tetanus shot  before. If you need a tetanus shot, and you decide not to get one, there is a rare chance of getting tetanus. Sickness from tetanus can be serious. If you get a tetanus shot, your arm may swell, get red and warm to the touch at the shot site. This is common and not a problem. PREVENTION  Wear protective clothing. Long sleeves and pants are best.  Wear white clothes to see ticks more easily  Tuck your pant legs into your socks.  If walking on trail, stay in the middle of the trail to avoid brushing against bushes.  Put insect repellent on all exposed skin and along boot tops, pant legs and sleeve cuffs  Check clothing, hair and skin repeatedly and before coming inside.  Brush off any ticks that are not attached. SEEK MEDICAL CARE IF:   You cannot remove a tick or part of the tick that is left in the skin.  Unexplained fever.  Redness and swelling in the area of the tick bite.  Tender, swollen lymph glands.  Diarrhea.  Weight loss.  Cough.  Fatigue.  Muscle, joint or bone pain.  Belly pain.  Headache.  Rash. SEEK IMMEDIATE MEDICAL CARE IF:   You develop an oral temperature above 102 F (38.9 C).  You are having trouble walking or moving your legs.  Numbness in the legs.    pain.   Belly pain.   Headache.   Rash.  SEEK IMMEDIATE MEDICAL CARE IF:    You develop an oral temperature above 102 F (38.9 C).   You are having trouble walking or moving your legs.   Numbness in the legs.   Shortness of breath.   Confusion.   Repeated vomiting.  Document Released: 12/13/2000 Document Revised: 03/09/2012 Document Reviewed: 11/21/2008  [employer] Patient Information 2014 [employer].

## 2013-06-27 ENCOUNTER — Encounter: Payer: Self-pay | Admitting: *Deleted

## 2013-06-27 NOTE — Telephone Encounter (Signed)
Called patient to discuss sleep study results from [facility] 06/11/2013.  Discussed findings, recommendations and follow up care.  Patient understood well and all questions were answered.  She was already scheduled for follow up with Dr. Frances Furbish but that appt wasn't until September, we went ahead and canceled that appt and moved her follow up to Wednesday 07/21/2013 at 2 PM.  I explained she will be mailed a copy of the study report and Dr. Gerda Diss will also receive a copy.  A follow up letter with her appt date and time will also be sent as well. -sh

## 2013-06-28 NOTE — Progress Notes (Signed)
See media tab for full report  

## 2013-06-29 ENCOUNTER — Other Ambulatory Visit: Payer: Self-pay | Admitting: Family Medicine

## 2013-07-06 ENCOUNTER — Encounter: Payer: Self-pay | Admitting: *Deleted

## 2013-07-06 ENCOUNTER — Other Ambulatory Visit: Payer: Self-pay | Admitting: *Deleted

## 2013-07-08 ENCOUNTER — Encounter: Payer: Self-pay | Admitting: Family Medicine

## 2013-07-08 ENCOUNTER — Ambulatory Visit (INDEPENDENT_AMBULATORY_CARE_PROVIDER_SITE_OTHER): Payer: 59 | Admitting: Neurology

## 2013-07-08 ENCOUNTER — Encounter (INDEPENDENT_AMBULATORY_CARE_PROVIDER_SITE_OTHER): Payer: 59

## 2013-07-08 DIAGNOSIS — R531 Weakness: Secondary | ICD-10-CM

## 2013-07-08 DIAGNOSIS — R202 Paresthesia of skin: Secondary | ICD-10-CM

## 2013-07-08 DIAGNOSIS — M79609 Pain in unspecified limb: Secondary | ICD-10-CM

## 2013-07-08 DIAGNOSIS — Z0289 Encounter for other administrative examinations: Secondary | ICD-10-CM

## 2013-07-08 DIAGNOSIS — R209 Unspecified disturbances of skin sensation: Secondary | ICD-10-CM

## 2013-07-08 NOTE — Progress Notes (Signed)
Quick Note:  Please call and advise the patient that the recent EMG and nerve conduction velocity test, which is the electrical nerve and muscle test we we performed, was reported as within normal limits. We checked for signs of nerve damage or muscle disease. No further action is required on this test at this time. Please remind patient to keep any upcoming appointments or tests and to call us with any interim questions, concerns, problems or updates. Thanks,  Huston Foley, MD, PhD   ______

## 2013-07-08 NOTE — Progress Notes (Signed)
Quick Note:  I spoke to patient and relayed normal limits on EMG/NCV test, per Dr. Frances Furbish. ______

## 2013-07-08 NOTE — Procedures (Signed)
HISTORY:  Rebecca Carroll is a 42 year old patient with a history of fibromyalgia. The patient has a six-month history of intermittent episodes of flushing of the arms and legs associated with a sunburn feeling and some numbness and tingly sensations in the feet and hands. All of these symptoms are intermittent, and the patient may feel normal between the episodes. The patient does report some low back pain. The patient is being evaluated for a possible neuropathy or a radiculopathy.  NERVE CONDUCTION STUDIES:  Nerve conduction studies were performed on the right upper extremity. The distal motor latencies and motor amplitudes for the median and ulnar nerves were within normal limits. The F wave latencies and nerve conduction velocities for these nerves were also normal. The sensory latencies for the median and ulnar nerves were normal.  Nerve conduction studies were performed on both lower extremities. The distal motor latencies and motor amplitudes for the peroneal and posterior tibial nerves were within normal limits. The nerve conduction velocities for these nerves were also normal. The H reflex latencies were normal. The sensory latencies for the peroneal nerves were within normal limits.   EMG STUDIES:  EMG study was performed on the right upper extremity:  The first dorsal interosseous muscle reveals 2 to 4 K units with full recruitment. No fibrillations or positive waves were noted. The abductor pollicis brevis muscle reveals 2 to 4 K units with full recruitment. No fibrillations or positive waves were noted. The extensor indicis proprius muscle reveals 1 to 3 K units with full recruitment. No fibrillations or positive waves were noted. The pronator teres muscle reveals 2 to 3 K units with full recruitment. No fibrillations or positive waves were noted. The biceps muscle reveals 1 to 2 K units with full recruitment. No fibrillations or positive waves were noted. The triceps muscle reveals 2  to 4 K units with full recruitment. No fibrillations or positive waves were noted. The anterior deltoid muscle reveals 2 to 3 K units with full recruitment. No fibrillations or positive waves were noted. The cervical paraspinal muscles were tested at 2 levels. No abnormalities of insertional activity were seen at either level tested. There was good relaxation.  EMG study was performed on the right lower extremity:  The tibialis anterior muscle reveals 2 to 4K motor units with full recruitment. No fibrillations or positive waves were seen. The peroneus tertius muscle reveals 2 to 4K motor units with full recruitment. No fibrillations or positive waves were seen. The medial gastrocnemius muscle reveals 1 to 3K motor units with full recruitment. No fibrillations or positive waves were seen. The vastus lateralis muscle reveals 2 to 4K motor units with full recruitment. No fibrillations or positive waves were seen. The iliopsoas muscle reveals 2 to 4K motor units with full recruitment. No fibrillations or positive waves were seen. The biceps femoris muscle (long head) reveals 2 to 3K motor units with full recruitment. No fibrillations or positive waves were seen. The lumbosacral paraspinal muscles were tested at 3 levels, and revealed no abnormalities of insertional activity at all 3 levels tested. There was good relaxation.   IMPRESSION:  Nerve conduction studies done on both lower extremities and on the right upper extremity were within normal limits. There is no evidence of a peripheral neuropathy. EMG evaluation of the right upper and right lower extremities were normal. There is no evidence of a cervical or lumbosacral radiculopathy on the right by this study. A small fiber peripheral neuropathy can be missed by a standard nerve  conduction studies. Clinical correlation is required.  Marlan Palau MD 07/08/2013 10:55 AM  [facility] [address] [place],  [place] [address]  Phone [phone] Fax [phone]

## 2013-07-21 ENCOUNTER — Ambulatory Visit (INDEPENDENT_AMBULATORY_CARE_PROVIDER_SITE_OTHER): Payer: 59 | Admitting: Neurology

## 2013-07-21 ENCOUNTER — Encounter: Payer: Self-pay | Admitting: Neurology

## 2013-07-21 VITALS — BP 124/72 | HR 100 | Temp 98.0°F | Ht 63.0 in | Wt 162.0 lb

## 2013-07-21 DIAGNOSIS — R5383 Other fatigue: Secondary | ICD-10-CM

## 2013-07-21 DIAGNOSIS — R5381 Other malaise: Secondary | ICD-10-CM

## 2013-07-21 DIAGNOSIS — M542 Cervicalgia: Secondary | ICD-10-CM

## 2013-07-21 DIAGNOSIS — R209 Unspecified disturbances of skin sensation: Secondary | ICD-10-CM

## 2013-07-21 DIAGNOSIS — M545 Low back pain, unspecified: Secondary | ICD-10-CM

## 2013-07-21 DIAGNOSIS — R7981 Abnormal blood-gas level: Secondary | ICD-10-CM

## 2013-07-21 NOTE — Patient Instructions (Addendum)
I think overall you are doing fairly well and are stable at this point.   I do have some generic suggestions for you today:  Please make sure that you drink plenty of fluids. I would like for you to exercise daily for example in the form of walking 20-30 minutes every day, if you can. Please keep a regular sleep-wake schedule, keep regular meal times, do not skip any meals, eat  healthy snacks in between meals, such as fruit or nuts. Try to eat protein with every meal.   As far as your medications are concerned, I would like to suggest: no changes.  As far as diagnostic testing, I recommend: referral to cardiology, pulmonology, neck MRI  I do not think we need to make any changes in your medications at this point. I think you're stable enough that I can see you back in 6 months, sooner if we need to. Please call us if you have any interim questions, concerns, or problems or updates to need to discuss.  Please also call us for any test results so we can go over those with you on the phone. Brett Canales is my clinical assistant and will answer any of your questions and relay your messages to me and will give you my messages.   Our phone number is [phone]. We also have an after hours call service for urgent matters and there is a physician on-call for urgent questions. For any emergencies you know to call 911 or go to the nearest emergency room.

## 2013-07-21 NOTE — Progress Notes (Signed)
Subjective:    Patient ID: Rebecca Carroll is a 42 y.o. female.  HPI  Interim history:   Rebecca Carroll is a very pleasant 42 year old right-handed woman with an underlying medical history of hyperlipidemia, hypertension, fibromyalgia, reflux disease, hypothyroidism and celiac disease who presents for followup consultation of her paresthesias of her hands and feet for the past several months. I first met her on 05/31/2013, at which time we talked about her recent brain MRI without contrast from 01/28/2013 which was negative. We also reviewed her blood work which showed a mildly elevated ESR 24, potassium at 3.1, and TSH mildly elevated at 5.7. She had an increase in her synthroid since then. She endorse daytime somnolence and heavy snoring and I suggested a sleep study. Her physical exam is nonfocal. She had a baseline sleep study on 06/11/2013 and I went over her test results with her in detail today. The sleep efficiency was 88.6%. She had a normal arousal index. She had an increased percentage of stage II sleep, a normal percentage of slow-wave sleep, and decreased percentage of REM sleep at 14% with a prolonged REM latency of 221.5 minutes. She had mild intermittent snoring and had a normal AHI of 2.4 per hour. Her baseline oxygen saturation was 92% with a nadir of 86% and she spent 1 hour and 20 minutes below the saturation of 90%. She had periodic leg movements which were mild with a very low arousal index. She describes unchanged paresthesias. No weakness reported, she fell outside, but did not hurt herself, no head injury, no LOC. She felt her balance was off a few weeks ago. She had shoulder surgery 3 years ago. She c/o LBP. She reports problems with her balance. She had a N EMG/NCV test on 7/10 and normal blood work otherwise. She denies CP, some SOBOE. She denies asthma, Hx of pneumonia or bronchitis.   Her Past Medical History Is Significant For: Past Medical History  Diagnosis Date  . Anxiety   .  Hypertension   . High cholesterol   . GERD (gastroesophageal reflux disease)   . Fibromyalgia   . Migraine   . Hypothyroidism   . History of anemia   . Celiac disease   . Migraine   . Fibromyalgia   . Scalp psoriasis     Her Past Surgical History Is Significant For: Past Surgical History  Procedure Laterality Date  . Tonsillectomy    . Endometrial ablation    . Esophagogastroduodenoscopy      Her Family History Is Significant For: Family History  Problem Relation Age of Onset  . Lung cancer Maternal Grandfather   . Diabetes Son   . Heart disease Maternal Grandmother   . Heart disease Paternal Grandmother   . Diverticulosis Father   . Hypertension Father   . Lung cancer Maternal Aunt   . Cirrhosis Paternal Uncle     Her Social History Is Significant For: History   Social History  . Marital Status: Married    Spouse Name: N/A    Number of Children: 2  . Years of Education: N/A   Occupational History  . nurse sec [employer]   Social History Main Topics  . Smoking status: Never Smoker   . Smokeless tobacco: Never Used  . Alcohol Use: Yes     Comment: social  . Drug Use: No  . Sexually Active: None   Other Topics Concern  . None   Social History Narrative  . None    Her  Allergies Are:  No Known Allergies:   Her Current Medications Are:  Outpatient Encounter Prescriptions as of 07/21/2013  Medication Sig Dispense Refill  . ALPRAZolam (XANAX) 0.5 MG tablet Take 1 tablet (0.5 mg total) by mouth 3 (three) times daily as needed for sleep or anxiety (use half tablet during the day .Marland Kitchen..half or whole at bedtime prn).  30 tablet  0  . amLODipine (NORVASC) 10 MG tablet TAKE 1 TABLET BY MOUTH DAILY  30 tablet  5  . amLODipine (NORVASC) 5 MG tablet Take 5 mg by mouth daily.      . cholecalciferol (VITAMIN D) 1000 UNITS tablet Take 1,000 Units by mouth daily.      . DULoxetine (CYMBALTA) 60 MG capsule Take 60 mg by mouth daily.        . Fe Fum-FePoly-Vit C-Vit B3  (INTEGRA) 62.5-62.5-40-3 MG CAPS TAKE 1 CAPSULE BY MOUTH DAILY.  30 capsule  3  . losartan-hydrochlorothiazide (HYZAAR) 50-12.5 MG per tablet Take 1 tablet by mouth daily.       . Methotrexate Sodium (METHOTREXATE PO) Take 10 tablets by mouth once a week.      Marland Kitchen omeprazole (PRILOSEC) 20 MG capsule Take 20 mg by mouth daily as needed (indigestion).      . phentermine 37.5 MG capsule Take 1 capsule (37.5 mg total) by mouth every morning.  30 capsule  2  . [DISCONTINUED] doxycycline (VIBRAMYCIN) 100 MG capsule Take 1 capsule (100 mg total) by mouth 2 (two) times daily.  20 capsule  0   No facility-administered encounter medications on file as of 07/21/2013.  :  Review of Systems  Constitutional: Positive for fatigue.  Respiratory:       Snoring  Cardiovascular: Positive for leg swelling.  Endocrine: Positive for heat intolerance.  Musculoskeletal: Positive for myalgias and arthralgias.  Neurological:       Restless leg  Psychiatric/Behavioral: Positive for dysphoric mood. The patient is nervous/anxious.     Objective:  Neurologic Exam  Physical Exam Physical Examination:   Filed Vitals:   07/21/13 1354  BP: 124/72  Pulse: 100  Temp: 98 F (36.7 C)    General Examination: The patient is a very pleasant 42 y.o. female in no acute distress. She appears well-developed and well-nourished and well groomed.   HEENT: Normocephalic, atraumatic, pupils are equal, round and reactive to light and accommodation. Funduscopic exam is normal with sharp disc margins noted. Extraocular tracking is good without limitation to gaze excursion or nystagmus noted. Normal smooth pursuit is noted. Hearing is grossly intact. Tympanic membranes are clear bilaterally. Face is symmetric with normal facial animation and normal facial sensation. Speech is clear with no dysarthria noted. There is no hypophonia. There is no lip, neck/head, jaw or voice tremor. Neck is supple with full range of passive and active  motion. There are no carotid bruits on auscultation. Oropharynx exam reveals: mild mouth dryness, good dental hygiene and moderate airway crowding, due to larger tongue, narrow airway and redundant soft palate. Mallampati is class III. Tongue protrudes centrally and palate elevates symmetrically. Neck size is 14.5 inches.   Chest: Clear to auscultation without wheezing, rhonchi or crackles noted.  Heart: S1+S2+0, regular and normal without murmurs, rubs or gallops noted.   Abdomen: Soft, non-tender and non-distended with normal bowel sounds appreciated on auscultation.  Extremities: There is no pitting edema in the distal lower extremities bilaterally. Pedal pulses are intact.  Skin: Warm and dry without trophic changes noted. There are no varicose veins.  Musculoskeletal: exam reveals no obvious joint deformities, tenderness or joint swelling or erythema.   Neurologically:  Mental status: The patient is awake, alert and oriented in all 4 spheres. Her memory, attention, language and knowledge are appropriate. There is no aphasia, agnosia, apraxia or anomia. Speech is clear with normal prosody and enunciation. Thought process is linear. Mood is congruent and affect is normal.  Cranial nerves are as described above under HEENT exam. In addition, shoulder shrug is normal with equal shoulder height noted. Motor exam: Normal bulk, strength and tone is noted. There is no drift, tremor or rebound. Romberg is negative. Reflexes are 2+ throughout. Toes are downgoing bilaterally. Fine motor skills are intact with normal finger taps, normal hand movements, normal rapid alternating patting, normal foot taps and normal foot agility.  Cerebellar testing shows no dysmetria or intention tremor on finger to nose testing. Heel to shin is unremarkable bilaterally. There is no truncal or gait ataxia.  Sensory exam is intact to light touch, pinprick, vibration, temperature sense and proprioception in the upper and  lower extremities.  Gait, station and balance are unremarkable. No veering to one side is noted. No leaning to one side is noted. Posture is age-appropriate and stance is narrow based. No problems turning are noted. She turns en bloc. Tandem walk is unremarkable. Intact toe and heel stance is noted.               Assessment and Plan:   In summary, Rebecca Carroll is a very pleasant 42 y.o.-year old female with a history of paresthesias, burning pain, and increase in temperature with redness reported in her legs and arm/hands. She reports occasional weakness, neck pain, LBP. Her physical exam continues to be non-focal and w/u thus far is negative with N brain MRI, N EMG, and NCV, normal labs, except mildly elevated ESR and TSH. She had a benign sleep study but showed lower than normal O2 sats in sleep,. Today I suggested further workup in the form of a neck MRI and referrals to pulmonology and cardiology. I will see her back in 6 months from now, sooner if the need arises. I answered all her questions today and encouraged her to call with any other questions, concerns, problems or updates. She was in agreement.

## 2013-07-22 ENCOUNTER — Ambulatory Visit (INDEPENDENT_AMBULATORY_CARE_PROVIDER_SITE_OTHER): Payer: 59 | Admitting: Family Medicine

## 2013-07-22 ENCOUNTER — Encounter: Payer: Self-pay | Admitting: Neurology

## 2013-07-22 ENCOUNTER — Encounter: Payer: Self-pay | Admitting: Family Medicine

## 2013-07-22 VITALS — BP 128/82 | Wt 162.8 lb

## 2013-07-22 DIAGNOSIS — E785 Hyperlipidemia, unspecified: Secondary | ICD-10-CM

## 2013-07-22 DIAGNOSIS — L408 Other psoriasis: Secondary | ICD-10-CM

## 2013-07-22 DIAGNOSIS — G4733 Obstructive sleep apnea (adult) (pediatric): Secondary | ICD-10-CM

## 2013-07-22 DIAGNOSIS — L409 Psoriasis, unspecified: Secondary | ICD-10-CM | POA: Insufficient documentation

## 2013-07-22 LAB — LIPID PANEL
Cholesterol: 210 mg/dL — ABNORMAL HIGH (ref 0–200)
HDL: 43 mg/dL (ref >39)
LDL Cholesterol: 113 mg/dL — ABNORMAL HIGH (ref 0–99)
Total CHOL/HDL Ratio: 4.9 Ratio
Triglycerides: 268 mg/dL — ABNORMAL HIGH (ref <150)
VLDL: 54 mg/dL — ABNORMAL HIGH (ref 0–40)

## 2013-07-22 LAB — HEPATIC FUNCTION PANEL
ALT: 13 U/L (ref 0–35)
AST: 14 U/L (ref 0–37)
Albumin: 3.9 g/dL (ref 3.5–5.2)
Alkaline Phosphatase: 61 U/L (ref 39–117)
Bilirubin, Direct: 0.1 mg/dL (ref 0.0–0.3)
Indirect Bilirubin: 0.3 mg/dL (ref 0.0–0.9)
Total Bilirubin: 0.4 mg/dL (ref 0.3–1.2)
Total Protein: 6.4 g/dL (ref 6.0–8.3)

## 2013-07-22 LAB — BASIC METABOLIC PANEL
BUN: 15 mg/dL (ref 6–23)
CO2: 29 mEq/L (ref 19–32)
Calcium: 9.1 mg/dL (ref 8.4–10.5)
Chloride: 103 mEq/L (ref 96–112)
Creat: 0.56 mg/dL (ref 0.50–1.10)
Glucose, Bld: 87 mg/dL (ref 70–99)
Potassium: 3.7 mEq/L (ref 3.5–5.3)
Sodium: 138 mEq/L (ref 135–145)

## 2013-07-22 NOTE — Progress Notes (Signed)
  Subjective:    Patient ID: Rebecca Carroll, female    DOB: 1971-10-16, 42 y.o.   MRN: [id number]  HPI PAtient arrives to follow up on lab work and to discuss recent office visit with the specialist. Patient came in today to discuss her recent test with the specialist including sleep study lab work. All this was reviewed in detail the note from the specialist was also reviewed in detail. 20 minutes spent with patient discussing these issues. She will be seeing a pulmonologist in the near future and cardiologist. The best I can gather is the neurologist was concerned about her oxygen levels dropping at nighttime and wanted to know what could be the cause of that sleep study did not show enough findings to warned CPAP machine certainly could be the origin behind oxygen dropping and a relative fashion.  Patient does have a history of borderline thyroid scalp psoriasis fibromyalgia also unspecified neurologic symptoms in followed by specialist plus a history of hypertension.  She does relate taking her medicines as prescribed.   Review of Systems See above    Objective:   Physical Exam  Lungs are clear hearts regular pulse normal extremities no edema pulse normal      Assessment & Plan:  Fibromyalgia-stable Mild sleep apnea. I don't think she needs CPAP she will be seen pulmonologist  in the near future she will also be seen a cardiologist although I am not 100% clear with the reason for this she will decide if she will want to see cardiology.

## 2013-07-25 ENCOUNTER — Encounter: Payer: Self-pay | Admitting: Family Medicine

## 2013-07-29 ENCOUNTER — Ambulatory Visit (INDEPENDENT_AMBULATORY_CARE_PROVIDER_SITE_OTHER): Payer: 59

## 2013-07-29 DIAGNOSIS — M545 Low back pain, unspecified: Secondary | ICD-10-CM

## 2013-07-29 DIAGNOSIS — M542 Cervicalgia: Secondary | ICD-10-CM

## 2013-07-29 DIAGNOSIS — R7981 Abnormal blood-gas level: Secondary | ICD-10-CM

## 2013-07-29 DIAGNOSIS — R209 Unspecified disturbances of skin sensation: Secondary | ICD-10-CM

## 2013-07-29 DIAGNOSIS — R5383 Other fatigue: Secondary | ICD-10-CM

## 2013-07-29 DIAGNOSIS — R5381 Other malaise: Secondary | ICD-10-CM

## 2013-07-30 MED ORDER — GADOPENTETATE DIMEGLUMINE 469.01 MG/ML IV SOLN: 15.0000 mL | Freq: Once | INTRAVENOUS | Status: AC | PRN

## 2013-08-02 NOTE — Progress Notes (Signed)
Quick Note:  Please advise patient that her recent C-spine MRI showed disc protrusion at level C6-7, but no impingement of the spinal cord or cord signal changes or any nerve root compression or any other significant degenerative changes.  Huston Foley, MD, PhD [facility] ([facility])  ______

## 2013-08-04 ENCOUNTER — Telehealth: Payer: Self-pay

## 2013-08-04 ENCOUNTER — Encounter: Payer: Self-pay | Admitting: Family Medicine

## 2013-08-04 ENCOUNTER — Ambulatory Visit (INDEPENDENT_AMBULATORY_CARE_PROVIDER_SITE_OTHER): Payer: 59 | Admitting: Family Medicine

## 2013-08-04 VITALS — BP 112/78 | Temp 97.9°F | Ht 62.0 in | Wt 160.0 lb

## 2013-08-04 DIAGNOSIS — R300 Dysuria: Secondary | ICD-10-CM

## 2013-08-04 LAB — POCT URINALYSIS DIPSTICK
Spec Grav, UA: 1.015
pH, UA: 5

## 2013-08-04 MED ORDER — CIPROFLOXACIN HCL 500 MG PO TABS: 500.0000 mg | ORAL_TABLET | Freq: Two times a day (BID) | ORAL | Status: AC

## 2013-08-04 NOTE — Progress Notes (Signed)
  Subjective:    Patient ID: Rebecca Carroll, female    DOB: March 26, 1971, 42 y.o.   MRN: [id number]  HPI Patient states that she has been experiencing body aches, fever, chills and burning with urination for 3-4 days now. She completed a urine test yesterday and it came back negative. Patient did have a tick bite approximately 3-4 weeks ago she's not having any rash. She did have burning with urination yesterday urine yesterday was negative according to patient we recheck a urine today there is an occasional white blood cell. PMH benign otherwise family history noncontributory social doesn't smoke  Review of Systems See above    Objective:   Physical Exam Patient not toxic neck is supple lungs are clear no crackles heart is regular extremities no edema skin warm dry abdomen soft  UA with occasional WBC       Assessment & Plan:  Possible UTI-Cipro twice a day, culture urine, if progressive symptoms may need to use doxycycline to cover for the possibility of tick related illness, may also need to do lab work as well

## 2013-08-04 NOTE — Telephone Encounter (Addendum)
Message copied by [facility] on Wed Aug 04, 2013  4:17 PM ------      Message from: Huston Foley      Created: Mon Aug 02, 2013  5:37 PM       Please advise patient that her recent C-spine MRI showed disc protrusion at level C6-7, but no impingement of the spinal cord or cord signal changes or any nerve root compression or any other significant degenerative changes.       Huston Foley, MD, PhD      [facility])       ------I called patient and provided her with MD report from MRI. She is wondering where you and she go from here due to her sensation of feeling like she has a severe sunburn on her arms and legs that comes and goes. Please advise. She does not have a follow up appointment scheduled at this time.

## 2013-08-05 NOTE — Telephone Encounter (Signed)
Please have patient discuss this with her primary care physician. She may benefit from an evaluation through dermatology and/or rheumatology. I am not sure as to what I can offer at this time and terms of further evaluation. We have done multiple tests. Please ask her to discuss her recent symptoms with her primary care physician and relay my recommendation to her. Thanks, Huston Foley, MD, PhD [facility])

## 2013-08-06 ENCOUNTER — Encounter: Payer: Self-pay | Admitting: Internal Medicine

## 2013-08-06 LAB — URINE CULTURE: Colony Count: 9000

## 2013-08-06 NOTE — Telephone Encounter (Signed)
Called patient and relayed Dr. Teofilo Pod previous note. Patient was very grateful and pleased with her visit with Dr. Frances Furbish, and agreed with her recommendation.

## 2013-08-10 ENCOUNTER — Encounter: Payer: Self-pay | Admitting: Internal Medicine

## 2013-08-10 ENCOUNTER — Ambulatory Visit (INDEPENDENT_AMBULATORY_CARE_PROVIDER_SITE_OTHER): Payer: 59 | Admitting: Internal Medicine

## 2013-08-10 ENCOUNTER — Other Ambulatory Visit (INDEPENDENT_AMBULATORY_CARE_PROVIDER_SITE_OTHER): Payer: 59

## 2013-08-10 VITALS — BP 102/70 | HR 100 | Ht 62.0 in | Wt 162.6 lb

## 2013-08-10 DIAGNOSIS — R109 Unspecified abdominal pain: Secondary | ICD-10-CM

## 2013-08-10 DIAGNOSIS — K5900 Constipation, unspecified: Secondary | ICD-10-CM

## 2013-08-10 DIAGNOSIS — K900 Celiac disease: Secondary | ICD-10-CM

## 2013-08-10 LAB — CBC
HCT: 33.1 % — ABNORMAL LOW (ref 36.0–46.0)
Hemoglobin: 11.4 g/dL — ABNORMAL LOW (ref 12.0–15.0)
MCHC: 34.3 g/dL (ref 30.0–36.0)
MCV: 90.4 fl (ref 78.0–100.0)
Platelets: 336 10*3/uL (ref 150.0–400.0)
RBC: 3.66 Mil/uL — ABNORMAL LOW (ref 3.87–5.11)
RDW: 12.8 % (ref 11.5–14.6)
WBC: 8.3 10*3/uL (ref 4.5–10.5)

## 2013-08-10 LAB — IBC PANEL
Iron: 59 ug/dL (ref 42–145)
Saturation Ratios: 17.7 % — ABNORMAL LOW (ref 20.0–50.0)
Transferrin: 238.7 mg/dL (ref 212.0–360.0)

## 2013-08-10 LAB — FERRITIN: Ferritin: 47.5 ng/mL (ref 10.0–291.0)

## 2013-08-10 MED ORDER — LINACLOTIDE 290 MCG PO CAPS: 1.0000 | ORAL_CAPSULE | Freq: Every day | ORAL | Status: DC

## 2013-08-10 NOTE — Patient Instructions (Addendum)
Your physician has requested that you go to the basement for the following lab work before leaving today:  We have sent the following medications to your pharmacy for you to pick up at your convenience: Linzess  Follow up with Dr. Rhea Belton in office in 1 month.                                                We are excited to introduce MyChart, a new best-in-class service that provides you online access to important information in your electronic medical record. We want to make it easier for you to view your health information - all in one secure location - when and where you need it. We expect MyChart will enhance the quality of care and service we provide.  When you register for MyChart, you can:    View your test results.    Request appointments and receive appointment reminders via email.    Request medication renewals.    View your medical history, allergies, medications and immunizations.    Communicate with your physician's office through a password-protected site.    Conveniently print information such as your medication lists.  To find out if MyChart is right for you, please talk to a member of our clinical staff today. We will gladly answer your questions about this free health and wellness tool.  If you are age 44 or older and want a member of your family to have access to your record, you must provide written consent by completing a proxy form available at our office. Please speak to our clinical staff about guidelines regarding accounts for patients younger than age 22.  As you activate your MyChart account and need any technical assistance, please call the MyChart technical support line at [phone]) [phone] [phone]) or email your question to [email or web address]. If you email your question(s), please include your name, a return phone number and the best time to reach you.  If you have non-urgent health-related questions, you can send a message to our office through  MyChart at [email or web address]. If you have a medical emergency, call 911.  Thank you for using MyChart as your new health and wellness resource!   MyChart licensed from [employer],  4540-9811. Patents Pending.

## 2013-08-10 NOTE — Progress Notes (Signed)
Subjective:    Patient ID: Rebecca Carroll, female    DOB: Feb 17, 1971, 42 y.o.   MRN: [id number]  HPI Rebecca Carroll is a 42 year old female with a past medical history of celiac disease, hypothyroidism, hypertension, and hypercholesterolemia who is seen in followup. She was initially evaluated for abdominal pain with bloating along with altered bowel habits. Upper endoscopy revealed celiac disease. She has been on a strict gluten-free diet since this time and her celiac symptoms have dramatically improved. She reports she is being 100% compliant with gluten-free diet. She's had no nausea or vomiting, and also no diarrhea or bloating. Occasionally she will inadvertently eat food which has been cross contaminated she will notice nausea, vomiting or increased bloating. She reports her biggest issue now is constipation. Previously before her celiac diagnosis this was more intermittent, but now it is more consistent. She reports small and hard stools at times mid to lower abdominal cramping pain. The pain can be sharp, and she is not certain it relates to bowel movement or eating. No fevers or chills. No weight loss, and infectious gained approximately 7 pounds. No rectal bleeding or melena. She has noticed lower extremity swelling over the last year and this is being evaluated by her primary physician. She also reports a recent tick bite associated joint pains and dysuria. She completed 3 days of ciprofloxacin recently and was given a prescription for doxycycline which she did not take. Her joint pains have improved. She denies a rash associated with the tick bite  Colonoscopy was initially planned on the same day as her upper endoscopy when her celiac disease was diagnosed. However she had no bowel movement despite a full colonoscopy prep and therefore the colonoscopy was canceled.   Review of Systems As per history of present illness, otherwise negative  Current Medications, Allergies, Past Medical History,  Past Surgical History, Family History and Social History were reviewed in [employer] record.     Objective:   Physical Exam BP 102/70  Pulse 100  Ht 5\' 2"  (1.575 m)  Wt 162 lb 9.6 oz (73.755 kg)  BMI 29.73 kg/m2 Constitutional: Well-developed and well-nourished. No distress. HEENT: Normocephalic and atraumatic. Oropharynx is clear and moist. No oropharyngeal exudate. Conjunctivae are normal.  No scleral icterus. Neck: Neck supple. Trachea midline. Cardiovascular: Normal rate, regular rhythm and intact distal pulses. No M/R/G Pulmonary/chest: Effort normal and breath sounds normal. No wheezing, rales or rhonchi. Abdominal: Soft, mild diffuse tenderness without rebound or guarding, nondistended. Bowel sounds active throughout.  Extremities: no clubbing, cyanosis, 1+ lower extremity edema Neurological: Alert and oriented to person place and time. Skin: Skin is warm and dry. No rashes noted. Psychiatric: Normal mood and affect. Behavior is normal.  TSH - normal     Assessment & Plan:  42 year old female with a past medical history of celiac disease, hypothyroidism, hypertension, and hypercholesterolemia who is seen in followup.  1. Celiac disease -- she reports compliance with gluten-free diet as well as resolution of her upper GI symptoms including nausea, vomiting and abdominal bloating. She did have iron deficiency anemia which was felt to be celiac related, and I would like to check both her TTG antibody and CBC with iron studies today. Would like to ensure that both of these have normalized. Continue gluten-free diet  2.  Constipation with intermittent abdominal pain -- I recommended a trial of Linzess 290 mcg daily. She is asked to take this daily with breakfast. I would like to see her back  in about 6 weeks to assess her response. She continues to have abdominal pain or no improvement in constipation, we will likely proceed to colonoscopy. There are no  obstructive symptoms at present, and she denies ribbonlike stools or pencil thin stools.

## 2013-08-11 LAB — TISSUE TRANSGLUTAMINASE, IGA: Tissue Transglutaminase Ab, IgA: 6.9 U/mL (ref <20)

## 2013-08-12 ENCOUNTER — Encounter: Payer: Self-pay | Admitting: *Deleted

## 2013-08-12 ENCOUNTER — Encounter: Payer: Self-pay | Admitting: Adult Health

## 2013-08-12 ENCOUNTER — Ambulatory Visit (INDEPENDENT_AMBULATORY_CARE_PROVIDER_SITE_OTHER): Payer: 59 | Admitting: Adult Health

## 2013-08-12 VITALS — BP 104/70 | Ht 62.0 in | Wt 164.1 lb

## 2013-08-12 DIAGNOSIS — R0609 Other forms of dyspnea: Secondary | ICD-10-CM

## 2013-08-12 DIAGNOSIS — E785 Hyperlipidemia, unspecified: Secondary | ICD-10-CM

## 2013-08-12 DIAGNOSIS — I10 Essential (primary) hypertension: Secondary | ICD-10-CM

## 2013-08-12 DIAGNOSIS — R0600 Dyspnea, unspecified: Secondary | ICD-10-CM

## 2013-08-12 DIAGNOSIS — R0989 Other specified symptoms and signs involving the circulatory and respiratory systems: Secondary | ICD-10-CM

## 2013-08-12 NOTE — Assessment & Plan Note (Signed)
She has risk factors for CAD to include hypertension hypercholesterolemia and thyroid disease along with family history in her grandmothers both paternal and maternal who died of MI or cardiac associated heart failure with a history of CAD. She has significant dyspnea on exertion stating that she cannot walk across the parking lot without being short of breath. She also states that she has frequent palpitations usually occurring at night which awaken her. EKG reveals normal sinus rhythm without arrhythmia. We will plan a stress echocardiogram for evaluation of LV function and heart rate and EKG changes in the setting of exercise and dyspnea. If abnormal we will plan further invasive testing. I explained the test the patient verbalizes understanding and is willing to proceed. No labs will be drawn and she is recently had these completed. I discussed this with Dr. Diona Browner and signed consent agreement with my plan. She will followup for discussion of test results.

## 2013-08-12 NOTE — Progress Notes (Signed)
HPI: [name] is a 42 year old patient referred to [place] by RebeccaLuking for assessment of dyspnea on exertion, and palpitations, with a history of hypertension, hypercholesterolemia, and hypothyroidism. The patient has been evaluated by neurologists and not found to have significant sleep apnea. She complains of dyspnea on exertion walking 100 feet, unable to climb stairs without significant shortness of breath, frequent palpitations throughout the day sometimes awakening her at night time. She is medically compliant, has become frustrated with her symptoms. He has no prior cardiac history or cardiac testing. She denies significant chest pain, dizziness, or diaphoresis associated with her dyspnea. She admits to drinking two 20 ounce bottles of Dr. Reino Carroll a day. He denies any significant stress.   No Known Allergies  Current Outpatient Prescriptions  Medication Sig Dispense Refill  . ALPRAZolam (XANAX) 0.5 MG tablet Take 1 tablet (0.5 mg total) by mouth 3 (three) times daily as needed for sleep or anxiety (use half tablet during the day .Marland Kitchen..half or whole at bedtime prn).  30 tablet  0  . amLODipine (NORVASC) 10 MG tablet TAKE 1 TABLET BY MOUTH DAILY  30 tablet  5  . cholecalciferol (VITAMIN D) 1000 UNITS tablet Take 1,000 Units by mouth daily.      . DULoxetine (CYMBALTA) 60 MG capsule Take 60 mg by mouth daily.        . Fe Fum-FePoly-Vit C-Vit B3 (INTEGRA) 62.5-62.5-40-3 MG CAPS TAKE 1 CAPSULE BY MOUTH DAILY.  30 capsule  3  . Linaclotide (LINZESS) 290 MCG CAPS Take 1 capsule by mouth daily.  30 capsule  6  . losartan-hydrochlorothiazide (HYZAAR) 50-12.5 MG per tablet Take 1 tablet by mouth daily.       . Methotrexate Sodium (METHOTREXATE PO) Take 10 tablets by mouth once a week.      Marland Kitchen omeprazole (PRILOSEC) 20 MG capsule Take 20 mg by mouth daily as needed (indigestion).       No current facility-administered medications for this visit.    Past Medical History  Diagnosis Date  . Anxiety   .  Hypertension   . High cholesterol   . GERD (gastroesophageal reflux disease)   . Fibromyalgia   . Migraine   . Hypothyroidism   . History of anemia   . Celiac disease   . Migraine   . Fibromyalgia   . Scalp psoriasis     Past Surgical History  Procedure Laterality Date  . Tonsillectomy    . Endometrial ablation    . Esophagogastroduodenoscopy      Family History  Problem Relation Age of Onset  . Lung cancer Maternal Grandfather   . Diabetes Son   . Heart disease Maternal Grandmother   . Heart disease Paternal Grandmother   . Diverticulosis Father   . Hypertension Father   . Lung cancer Maternal Aunt   . Cirrhosis Paternal Uncle     History   Social History  . Marital Status: Married    Spouse Name: N/A    Number of Children: 2  . Years of Education: N/A   Occupational History  . nurse sec [employer]   Social History Main Topics  . Smoking status: Never Smoker   . Smokeless tobacco: Never Used  . Alcohol Use: Yes     Comment: social  . Drug Use: No  . Sexual Activity: Not on file   Other Topics Concern  . Not on file   Social History Narrative  . No narrative on file    WUJ:WJXBJY of  systems complete and found to be negative unless listed above   PHYSICAL EXAM BP 104/70  Ht 5\' 2"  (1.575 m)  Wt 164 lb 1.9 oz (74.444 kg)  BMI 30.01 kg/m2  General: Well developed, well nourished, in no acute distress Head: Eyes PERRLA, No xanthomas.   Normal cephalic and atramatic  Lungs: Clear bilaterally to auscultation, no wheezes cough Heart: HRRR S1 S2, without MRG.  Pulses are 2+ & equal.            No carotid bruit. No JVD.  Abdomen: Bowel sounds are positive, abdomen soft and non-tender without masses or                  Hernia's noted. Msk:  Back normal, normal gait. Normal strength and tone for age. Extremities: No clubbing, cyanosis , with nonpitting edema.  DP +1 Neuro: Alert and oriented X 3. Psych:  Good affect, responds appropriately  EKG:  Normal sinus rhythm rate of 77 beats per minute.  ASSESSMENT AND PLAN

## 2013-08-12 NOTE — Patient Instructions (Addendum)
Your physician recommends that you schedule a follow-up appointment in: post stress test.  Your physician has requested that you have a stress echocardiogram. For further information please visit [email or web address] Please follow instruction sheet as given.

## 2013-08-12 NOTE — Assessment & Plan Note (Signed)
She is followed by primary care for ongoing labs. Most recent labs drawn for cholesterol status was in July 2014 demonstrating a cholesterol 210, triglycerides 268, HDL 43, LDL 113. Advised a low cholesterol diet. She is not on a statin at this time.

## 2013-08-12 NOTE — Assessment & Plan Note (Signed)
Blood pressure is low normal currently. She does have some mild nonpitting edema in her lower chin these which she states becomes worse throughout the day. She is on both amlodipine and methotrexate which cannot cause fluid retention in the lower extremities. Other by sternal as sodium diet. Not make any medication changes at this time.

## 2013-08-12 NOTE — Progress Notes (Deleted)
Name: Rebecca Carroll    DOB: 1971/04/16  Age: 42 y.o.  MR#: [id number]       PCP:  Lilyan Punt, MD      Insurance: Payor: [employer] / Plan: [employer] / Product Type: *No Product type* /   CC:   No chief complaint on file.   VS There were no vitals filed for this visit.  Weights Current Weight  08/10/13 162 lb 9.6 oz (73.755 kg)  08/04/13 160 lb (72.576 kg)  07/22/13 162 lb 12.8 oz (73.846 kg)    Blood Pressure  BP Readings from Last 3 Encounters:  08/10/13 102/70  08/04/13 112/78  07/22/13 128/82     Admit date:  (Not on file) Last encounter with RMR:  Visit date not found   Allergy Review of patient's allergies indicates no known allergies.  Current Outpatient Prescriptions  Medication Sig Dispense Refill  . ALPRAZolam (XANAX) 0.5 MG tablet Take 1 tablet (0.5 mg total) by mouth 3 (three) times daily as needed for sleep or anxiety (use half tablet during the day .Marland Kitchen..half or whole at bedtime prn).  30 tablet  0  . amLODipine (NORVASC) 10 MG tablet TAKE 1 TABLET BY MOUTH DAILY  30 tablet  5  . cholecalciferol (VITAMIN D) 1000 UNITS tablet Take 1,000 Units by mouth daily.      . DULoxetine (CYMBALTA) 60 MG capsule Take 60 mg by mouth daily.        . Fe Fum-FePoly-Vit C-Vit B3 (INTEGRA) 62.5-62.5-40-3 MG CAPS TAKE 1 CAPSULE BY MOUTH DAILY.  30 capsule  3  . Linaclotide (LINZESS) 290 MCG CAPS Take 1 capsule by mouth daily.  30 capsule  6  . losartan-hydrochlorothiazide (HYZAAR) 50-12.5 MG per tablet Take 1 tablet by mouth daily.       . Methotrexate Sodium (METHOTREXATE PO) Take 10 tablets by mouth once a week.      Marland Kitchen omeprazole (PRILOSEC) 20 MG capsule Take 20 mg by mouth daily as needed (indigestion).       No current facility-administered medications for this visit.    Discontinued Meds:   There are no discontinued medications.  Patient Active Problem List   Diagnosis Date Noted  . 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 Allergic rhinitis 04/07/2013  . Allergic  conjunctivitis 04/07/2013  . Unspecified visual disturbance 03/26/2013  . Celiac disease 02/17/2012  . GERD (gastroesophageal reflux disease) 12/26/2011  . Fibromyalgia 12/26/2011  . HTN (hypertension) 12/26/2011  . Hyperlipidemia 12/26/2011  . Hypothyroidism 12/26/2011    LABS    Component Value Date/Time   NA 138 07/21/2013 0735   K 3.7 07/21/2013 0735   CL 103 07/21/2013 0735   CO2 29 07/21/2013 0735   GLUCOSE 87 07/21/2013 0735   BUN 15 07/21/2013 0735   CREATININE 0.56 07/21/2013 0735   CALCIUM 9.1 07/21/2013 0735   CMP     Component Value Date/Time   NA 138 07/21/2013 0735   K 3.7 07/21/2013 0735   CL 103 07/21/2013 0735   CO2 29 07/21/2013 0735   GLUCOSE 87 07/21/2013 0735   BUN 15 07/21/2013 0735   CREATININE 0.56 07/21/2013 0735   CALCIUM 9.1 07/21/2013 0735   PROT 6.4 07/21/2013 0735   ALBUMIN 3.9 07/21/2013 0735   AST 14 07/21/2013 0735   ALT 13 07/21/2013 0735   ALKPHOS 61 07/21/2013 0735   BILITOT 0.4 07/21/2013 0735       Component Value Date/Time   WBC 8.3 08/10/2013 1608   WBC  6.4 05/19/2012 0833   WBC 5.2 02/17/2012 1654   HGB 11.4* 08/10/2013 1608   HGB 12.0 05/19/2012 0833   HGB 11.9* 02/17/2012 1654   HCT 33.1* 08/10/2013 1608   HCT 35.5* 05/19/2012 0833   HCT 35.8* 02/17/2012 1654   MCV 90.4 08/10/2013 1608   MCV 88.6 05/19/2012 0833   MCV 87.7 02/17/2012 1654    Lipid Panel     Component Value Date/Time   CHOL 210* 07/21/2013 0735   TRIG 268* 07/21/2013 0735   HDL 43 07/21/2013 0735   CHOLHDL 4.9 07/21/2013 0735   VLDL 54* 07/21/2013 0735   LDLCALC 113* 07/21/2013 0735    ABG No results found for this basename: phart, pco2, pco2art, po2, po2art, hco3, tco2, acidbasedef, o2sat     Lab Results  Component Value Date   TSH 2.594 04/23/2013   BNP (last 3 results) No results found for this basename: PROBNP,  in the last 8760 hours Cardiac Panel (last 3 results) No results found for this basename: CKTOTAL, CKMB, TROPONINI, RELINDX,  in the last 72 hours   Iron/TIBC/Ferritin    Component Value Date/Time   IRON 59 08/10/2013 1608   FERRITIN 47.5 08/10/2013 1608     EKG No orders found for this or any previous visit.   Prior Assessment and Plan Problem List as of 08/12/2013     Cardiovascular and Mediastinum   HTN (hypertension)     Respiratory   Allergic rhinitis   Last Assessment & Plan   04/07/2013 Office Visit Written 04/07/2013  5:19 PM by Campbell Riches, NP     Depo-Medrol 40 mg IM today. Start Nasacort AQ and Allegra OTC as directed. Avoid excessive exposure to pollen as much as possible. Call back if worsens or persists.      Digestive   Celiac disease   GERD (gastroesophageal reflux disease)     Endocrine   Hypothyroidism     Musculoskeletal and Integument   Fibromyalgia   Scalp psoriasis     Other   Unspecified visual disturbance   Hyperlipidemia   Allergic conjunctivitis   Last Assessment & Plan   04/07/2013 Office Visit Written 04/07/2013  5:19 PM by Campbell Riches, NP     OTC Zaditor eyedrops as directed.        Imaging: Mr Cervical Spine W Wo Contrast  07/30/2013   [facility] [address] [place], [place] [address] [phone]  NEUROIMAGING REPORT   STUDY DATE: 07/29/2013 PATIENT NAME: Rebecca Carroll DOB: 12-Nov-1971 MRN: [id number]  ORDERING CLINICIAN:  Dr Raul Del CLINICAL HISTORY: 42 year old lady being evaluated for paresthesias COMPARISON FILMS: none EXAM: MRi Cervical spine wo TECHNIQUE: MRI of the cervical spine was obtained utilizing 3 mm sagittal  slices from the posterior fossa down to the T3-4 level with T1, T2 and  inversion recovery views. In addition 4 mm axial slices from C2-3 down to  T1-2 level were included with T2 and gradient echo views. CONTRAST:  15 ml magnevist IMAGING SITE: [facility]  FINDINGS:  The cervical vertebrae demonstrate slight loss of forward lordotic  curvature but normal body height and bone marrow signal characteristics.  The intervertebral  discs show only minor degenerative changes suture most  prominent at C6-7 there is broad-based central disc protrusion with slight  effacement of the thecal sac but no significant cord compression, root or  foraminal stenosis is noted. The spinal cord parenchyma itself shows  normal signal characteristics. The visualized portion  of the brain stem,  cerebellum, craniovertebral junction and upper thoracic spine appear  unremarkable.      07/30/2013    Abnormal MRI scan of the brain showing central disc  protrusion at C6-7 but without significant compression, root or foraminal  stenosis noted.    INTERPRETING PHYSICIAN:  Delia Heady, MD Certified in  Neuroimaging by American Society of Neuroimaging and [employer]

## 2013-08-13 ENCOUNTER — Other Ambulatory Visit: Payer: Self-pay | Admitting: *Deleted

## 2013-08-13 NOTE — Addendum Note (Signed)
Addended by: Thompson Grayer on: 08/13/2013 10:58 AM   Modules accepted: Orders

## 2013-08-17 ENCOUNTER — Telehealth: Payer: Self-pay | Admitting: *Deleted

## 2013-08-17 ENCOUNTER — Encounter: Payer: Self-pay | Admitting: Internal Medicine

## 2013-08-17 ENCOUNTER — Encounter: Payer: Self-pay | Admitting: Cardiology

## 2013-08-17 DIAGNOSIS — R000 Tachycardia, unspecified: Secondary | ICD-10-CM

## 2013-08-17 NOTE — Telephone Encounter (Signed)
Received my chart message. Sent her a message stating I will call her back. Called pt with no answer; left message to call back office on voicemail.

## 2013-08-18 ENCOUNTER — Ambulatory Visit: Payer: 59 | Admitting: Cardiology

## 2013-08-26 ENCOUNTER — Encounter (HOSPITAL_COMMUNITY): Payer: Self-pay

## 2013-08-26 ENCOUNTER — Ambulatory Visit (HOSPITAL_COMMUNITY)
Admission: RE | Admit: 2013-08-26 | Discharge: 2013-08-26 | Disposition: A | Discharge status: Home / Self Care | Payer: 59 | Source: Ambulatory Visit | Attending: Adult Health | Admitting: Adult Health

## 2013-08-26 ENCOUNTER — Telehealth: Payer: Self-pay | Admitting: *Deleted

## 2013-08-26 DIAGNOSIS — R0989 Other specified symptoms and signs involving the circulatory and respiratory systems: Secondary | ICD-10-CM | POA: Insufficient documentation

## 2013-08-26 DIAGNOSIS — I10 Essential (primary) hypertension: Secondary | ICD-10-CM | POA: Insufficient documentation

## 2013-08-26 DIAGNOSIS — R0600 Dyspnea, unspecified: Secondary | ICD-10-CM

## 2013-08-26 DIAGNOSIS — R0609 Other forms of dyspnea: Secondary | ICD-10-CM | POA: Insufficient documentation

## 2013-08-26 NOTE — Progress Notes (Signed)
Stress Lab Nurses Notes - Rebecca Carroll  Rebecca Carroll 08/26/2013 Reason for doing test: SOB & Palpitation Type of test: Stress Echo Nurse performing test: Parke Poisson, RN Nuclear Medicine Tech: Not Applicable Echo Tech: Karrie Doffing MD performing test: Ival Bible / Joni Reining NP Family MD: Lilyan Punt Test explained and consent signed: yes IV started: No IV started Symptoms: Legs Fatigue Treatment/Intervention: None Reason test stopped: fatigue After recovery IV was: NA Patient to return to Nuc. Med at :NA Patient discharged: Home Patient's Condition upon discharge was: stable Comments: During stress test peak BP 146/72 & HR 162.  Recovery BP 120/76 & HR 85.  Symptoms resolved in recovery. Erskine Speed T

## 2013-08-26 NOTE — Progress Notes (Signed)
*  PRELIMINARY RESULTS* Echocardiogram Echocardiogram Stress Test has been performed.  [place] 08/26/2013, 10:37 AM

## 2013-08-26 NOTE — Telephone Encounter (Signed)
Message copied by Florene Glen on Thu Aug 26, 2013  4:27 PM ------      Message from: Beverley Fiedler      Created: Wed Aug 11, 2013  3:22 PM       TTG has normalized which is great news and indicates good control of celiac      Hgb still mildly low at 11.4, iron studies better, but % iron saturation still a bit low      Continue integra      Trial of constipation med, and then office follow-up in about 6 weeks ------

## 2013-08-26 NOTE — Telephone Encounter (Signed)
Pt never returned our call. Mailed her lab results and appt reminder.

## 2013-08-27 NOTE — Telephone Encounter (Signed)
This nurse spoke with [facility] NP and she advised for the pt to have the monitor on for 2 weeks for racing heart, called pt to see if she can come into at 4pm

## 2013-08-28 DIAGNOSIS — R000 Tachycardia, unspecified: Secondary | ICD-10-CM

## 2013-08-31 ENCOUNTER — Ambulatory Visit: Payer: 59 | Admitting: Neurology

## 2013-09-01 ENCOUNTER — Ambulatory Visit (INDEPENDENT_AMBULATORY_CARE_PROVIDER_SITE_OTHER): Payer: 59 | Admitting: Pulmonary Disease

## 2013-09-01 ENCOUNTER — Encounter: Payer: Self-pay | Admitting: Pulmonary Disease

## 2013-09-01 VITALS — BP 122/76 | HR 101 | Temp 98.4°F | Ht 62.0 in | Wt 163.0 lb

## 2013-09-01 DIAGNOSIS — R0600 Dyspnea, unspecified: Secondary | ICD-10-CM | POA: Insufficient documentation

## 2013-09-01 DIAGNOSIS — R0609 Other forms of dyspnea: Secondary | ICD-10-CM

## 2013-09-01 DIAGNOSIS — R0989 Other specified symptoms and signs involving the circulatory and respiratory systems: Secondary | ICD-10-CM

## 2013-09-01 NOTE — Patient Instructions (Addendum)
Will schedule for breathing tests in next few weeks, and will call when results are available. Work on weight loss and conditioning.

## 2013-09-01 NOTE — Progress Notes (Signed)
  Subjective:    Patient ID: Rebecca Carroll, female    DOB: 04-14-1971, 42 y.o.   MRN: [id number]  HPI The patient is a 42 year old female who I've been asked to see for dyspnea on exertion.  She describes dyspnea with moderate to heavy exertional activities over the last one year, and feels her symptoms are progressive.  She will not get winded bringing groceries in from the car or making a bed, but she will with one flight of stairs and vacuuming multiple rooms.  She also describes a 3-4 block dyspnea on exertion at a moderate pace on flat ground.  She denies any significant cough or mucus production, has never smoked, and has no history of asthma.  She has no personal or family history of thromboembolic disease or autoimmune disease.  She denies any pleuritic chest pain.  She has had some mild lower extremity edema, as well as an abnormal sensation that is being evaluated by neurology.  She had an unremarkable chest x-ray in February of this year, and a stress echo last month showed no evidence for ischemia.  It is unclear if they evaluated for pulmonary hypertension.  Patient states her weight is up greater than 10 pounds over the last 2 years, but has been neutral over the last one-year period.  She has had a sleep study in June of this year that showed an AHI of 2 of them per hour with desaturation transiently as low as 86%.  She did spend 28 minutes less than 88%, however her histogram shows that her desaturations occurred primarily between one and 3 AM during her block of sleep disordered breathing.  She did not desaturate with rem.     Review of Systems  Constitutional: Negative for fever and unexpected weight change.  HENT: Positive for ear pain, congestion and trouble swallowing. Negative for nosebleeds, sore throat, rhinorrhea, sneezing, dental problem, postnasal drip and sinus pressure.   Eyes: Negative for redness and itching.  Respiratory: Positive for shortness of breath. Negative for cough,  chest tightness and wheezing.   Cardiovascular: Positive for palpitations ( iregular heartbeats) and leg swelling ( lower legs).  Gastrointestinal: Positive for abdominal pain. Negative for nausea and vomiting.  Genitourinary: Negative for dysuria.  Musculoskeletal: Positive for joint swelling and arthralgias.  Skin: Negative for rash.  Neurological: Negative for headaches.  Hematological: Does not bruise/bleed easily.  Psychiatric/Behavioral: Negative for dysphoric mood. The patient is nervous/anxious.        Objective:   Physical Exam Constitutional:  Overweight female, no acute distress  HENT:  Nares patent without discharge  Oropharynx without exudate, palate and uvula are normal  Eyes:  Perrla, eomi, no scleral icterus  Neck:  No JVD, no TMG  Cardiovascular:  Normal rate, regular rhythm, no rubs or gallops.  No murmurs        Intact distal pulses  Pulmonary :  Normal breath sounds, no stridor or respiratory distress   No rales, rhonchi, or wheezing  Abdominal:  Soft, nondistended, bowel sounds present.  No tenderness noted.   Musculoskeletal: minimal lower extremity edema noted.  Lymph Nodes:  No cervical lymphadenopathy noted  Skin:  No cyanosis noted  Neurologic:  Alert, appropriate, moves all 4 extremities without obvious deficit.         Assessment & Plan:

## 2013-09-01 NOTE — Assessment & Plan Note (Signed)
The patient has worsening dyspnea on exertion over the last one year that primarily occurs with moderate to heavy or exertional activities.  There is nothing obvious from a pulmonary standpoint today based on her history and exam, however will need to do pulmonary function testing for further evaluation.  She is also completing a cardiac evaluation as well.  She did have desaturation during her sleep study, but this occurred during her block of sleep disordered breathing.  If her pulmonary and cardiac evaluations are unremarkable, I suspect this is related to her weight and deconditioning.  If she works on an aggressive exercise program and continues to have issues, we could consider a cardiopulmonary exercise test for further evaluation.

## 2013-09-07 ENCOUNTER — Ambulatory Visit: Payer: 59 | Admitting: Cardiology

## 2013-09-07 ENCOUNTER — Ambulatory Visit (INDEPENDENT_AMBULATORY_CARE_PROVIDER_SITE_OTHER): Payer: 59 | Admitting: Adult Health

## 2013-09-07 ENCOUNTER — Encounter: Payer: Self-pay | Admitting: Adult Health

## 2013-09-07 VITALS — Ht 62.0 in | Wt 160.0 lb

## 2013-09-07 DIAGNOSIS — R002 Palpitations: Secondary | ICD-10-CM | POA: Insufficient documentation

## 2013-09-07 MED ORDER — MAGNESIUM 400 MG PO CAPS: 1.0000 | ORAL_CAPSULE | Freq: Every day | ORAL | Status: DC

## 2013-09-07 NOTE — Patient Instructions (Addendum)
Your physician recommends that you schedule a follow-up appointment in: NEXT WEEK WITH DR MCDOWELL  YOUR PHYSICIAN RECOMMENDS THAT YOU CAN TAKE OFF YOUR EVENT MONITOR TODAY, WE WILL CALL [facility] TO ADVISE  Your physician has recommended you make the following change in your medication:   1) START TAKING MAGNESIUM 400MG  ONCE DAILY

## 2013-09-07 NOTE — Progress Notes (Deleted)
Name: Rebecca Carroll    DOB: [birth year]  Age: 42 y.o.  MR#: [id number]       PCP:  Lilyan Punt, MD      Insurance: Payor: [employer] / Plan: [employer] / Product Type: *No Product type* /   CC:    Chief Complaint  Patient presents with  . Hypertension    Still having shortness of breath with swelling in legs, elevated heart rate  . Shortness of Breath    VS Filed Vitals:   09/07/13 1531  Height: 5\' 2"  (1.575 m)  Weight: 160 lb 0.6 oz (72.594 kg)    Weights Current Weight  09/07/13 160 lb 0.6 oz (72.594 kg)  09/01/13 163 lb (73.936 kg)  08/12/13 164 lb 1.9 oz (74.444 kg)    Blood Pressure  BP Readings from Last 3 Encounters:  09/01/13 122/76  08/12/13 104/70  08/10/13 102/70     Admit date:  (Not on file) Last encounter with [facility]:  08/12/2013   Allergy Review of patient's allergies indicates no known allergies.  Current Outpatient Prescriptions  Medication Sig Dispense Refill  . ALPRAZolam (XANAX) 0.5 MG tablet Take 1 tablet (0.5 mg total) by mouth 3 (three) times daily as needed for sleep or anxiety (use half tablet during the day .Marland Kitchen..half or whole at bedtime prn).  30 tablet  0  . amLODipine (NORVASC) 10 MG tablet TAKE 1 TABLET BY MOUTH DAILY  30 tablet  5  . cholecalciferol (VITAMIN D) 1000 UNITS tablet Take 1,000 Units by mouth daily.      . DULoxetine (CYMBALTA) 60 MG capsule Take 60 mg by mouth daily.        . Fe Fum-FePoly-Vit C-Vit B3 (INTEGRA) 62.5-62.5-40-3 MG CAPS TAKE 1 CAPSULE BY MOUTH DAILY.  30 capsule  3  . Linaclotide (LINZESS) 290 MCG CAPS Take 1 capsule by mouth daily.  30 capsule  6  . losartan-hydrochlorothiazide (HYZAAR) 50-12.5 MG per tablet Take 1 tablet by mouth daily.       . Methotrexate Sodium (METHOTREXATE PO) Take 10 tablets by mouth once a week.      Marland Kitchen omeprazole (PRILOSEC) 20 MG capsule Take 20 mg by mouth daily as needed (indigestion).       No current facility-administered medications for this visit.    Discontinued Meds:  There are no discontinued medications.  Patient Active Problem List   Diagnosis Date Noted  . DOE (dyspnea on exertion) 09/01/2013  . Dyspnea 08/12/2013  . 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 Allergic rhinitis 04/07/2013  . Allergic conjunctivitis 04/07/2013  . Unspecified visual disturbance 03/26/2013  . Celiac disease 02/17/2012  . GERD (gastroesophageal reflux disease) 12/26/2011  . Fibromyalgia 12/26/2011  . HTN (hypertension) 12/26/2011  . Hyperlipidemia 12/26/2011  . Hypothyroidism 12/26/2011    LABS    Component Value Date/Time   NA 138 07/21/2013 0735   K 3.7 07/21/2013 0735   CL 103 07/21/2013 0735   CO2 29 07/21/2013 0735   GLUCOSE 87 07/21/2013 0735   BUN 15 07/21/2013 0735   CREATININE 0.56 07/21/2013 0735   CALCIUM 9.1 07/21/2013 0735   CMP     Component Value Date/Time   NA 138 07/21/2013 0735   K 3.7 07/21/2013 0735   CL 103 07/21/2013 0735   CO2 29 07/21/2013 0735   GLUCOSE 87 07/21/2013 0735   BUN 15 07/21/2013 0735   CREATININE 0.56 07/21/2013 0735   CALCIUM 9.1 07/21/2013 0735   PROT 6.4 07/21/2013 0735  ALBUMIN 3.9 07/21/2013 0735   AST 14 07/21/2013 0735   ALT 13 07/21/2013 0735   ALKPHOS 61 07/21/2013 0735   BILITOT 0.4 07/21/2013 0735       Component Value Date/Time   WBC 8.3 08/10/2013 1608   WBC 6.4 05/19/2012 0833   WBC 5.2 02/17/2012 1654   HGB 11.4* 08/10/2013 1608   HGB 12.0 05/19/2012 0833   HGB 11.9* 02/17/2012 1654   HCT 33.1* 08/10/2013 1608   HCT 35.5* 05/19/2012 0833   HCT 35.8* 02/17/2012 1654   MCV 90.4 08/10/2013 1608   MCV 88.6 05/19/2012 0833   MCV 87.7 02/17/2012 1654    Lipid Panel     Component Value Date/Time   CHOL 210* 07/21/2013 0735   TRIG 268* 07/21/2013 0735   HDL 43 07/21/2013 0735   CHOLHDL 4.9 07/21/2013 0735   VLDL 54* 07/21/2013 0735   LDLCALC 113* 07/21/2013 0735    ABG No results found for this basename: phart, pco2, pco2art, po2, po2art, hco3, tco2, acidbasedef, o2sat     Lab Results  Component Value Date   TSH 2.594  04/23/2013   BNP (last 3 results) No results found for this basename: PROBNP,  in the last 8760 hours Cardiac Panel (last 3 results) No results found for this basename: CKTOTAL, CKMB, TROPONINI, RELINDX,  in the last 72 hours  Iron/TIBC/Ferritin    Component Value Date/Time   IRON 59 08/10/2013 1608   FERRITIN 47.5 08/10/2013 1608     EKG Orders placed in visit on 08/17/13  . CARDIAC EVENT MONITOR     Prior Assessment and Plan Problem List as of 09/07/2013     Cardiovascular and Mediastinum   HTN (hypertension)   Last Assessment & Plan   08/12/2013 Office Visit Written 08/12/2013  4:59 PM by Jodelle Gross, NP     Blood pressure is low normal currently. She does have some mild nonpitting edema in her lower chin these which she states becomes worse throughout the day. She is on both amlodipine and methotrexate which cannot cause fluid retention in the lower extremities. Other by sternal as sodium diet. Not make any medication changes at this time.      Respiratory   Allergic rhinitis   Last Assessment & Plan   04/07/2013 Office Visit Written 04/07/2013  5:19 PM by Campbell Riches, NP     Depo-Medrol 40 mg IM today. Start Nasacort AQ and Allegra OTC as directed. Avoid excessive exposure to pollen as much as possible. Call back if worsens or persists.      Digestive   Celiac disease   GERD (gastroesophageal reflux disease)     Endocrine   Hypothyroidism     Musculoskeletal and Integument   Fibromyalgia   Scalp psoriasis     Other   Unspecified visual disturbance   Hyperlipidemia   Last Assessment & Plan   08/12/2013 Office Visit Written 08/12/2013  5:00 PM by Jodelle Gross, NP     She is followed by primary care for ongoing labs. Most recent labs drawn for cholesterol status was in July 2014 demonstrating a cholesterol 210, triglycerides 268, HDL 43, LDL 113. Advised a low cholesterol diet. She is not on a statin at this time.    Allergic conjunctivitis   Last  Assessment & Plan   04/07/2013 Office Visit Written 04/07/2013  5:19 PM by Campbell Riches, NP     OTC Zaditor eyedrops as directed.    Dyspnea   Last Assessment &  Plan   08/12/2013 Office Visit Written 08/12/2013  4:58 PM by Jodelle Gross, NP     She has risk factors for CAD to include hypertension hypercholesterolemia and thyroid disease along with family history in her grandmothers both paternal and maternal who died of MI or cardiac associated heart failure with a history of CAD. She has significant dyspnea on exertion stating that she cannot walk across the parking lot without being short of breath. She also states that she has frequent palpitations usually occurring at night which awaken her. EKG reveals normal sinus rhythm without arrhythmia. We will plan a stress echocardiogram for evaluation of LV function and heart rate and EKG changes in the setting of exercise and dyspnea. If abnormal we will plan further invasive testing. I explained the test the patient verbalizes understanding and is willing to proceed. No labs will be drawn and she is recently had these completed. I discussed this with Dr. Diona Browner and signed consent agreement with my plan. She will followup for discussion of test results.    DOE (dyspnea on exertion)   Last Assessment & Plan   09/01/2013 Office Visit Written 09/01/2013  4:36 PM by Barbaraann Share, MD     The patient has worsening dyspnea on exertion over the last one year that primarily occurs with moderate to heavy or exertional activities.  There is nothing obvious from a pulmonary standpoint today based on her history and exam, however will need to do pulmonary function testing for further evaluation.  She is also completing a cardiac evaluation as well.  She did have desaturation during her sleep study, but this occurred during her block of sleep disordered breathing.  If her pulmonary and cardiac evaluations are unremarkable, I suspect this is related to her weight and  deconditioning.  If she works on an aggressive exercise program and continues to have issues, we could consider a cardiopulmonary exercise test for further evaluation.        Imaging: No results found.

## 2013-09-07 NOTE — Assessment & Plan Note (Signed)
Good control of BP on current medications. Will not make any changes at this time. She is to see Dr. Diona Browner in a couple of weeks for discussion of cardiac monitor.,

## 2013-09-07 NOTE — Progress Notes (Signed)
HPI: [name] is a 42 year old patient of Dr. Diona Browner we are following for ongoing assessment and management of palpitations dyspnea on exertion with history of hypertension hypercholesterolemia and hypothyroidism. She was originally seen in the office on 08/12/2013 for a stress echocardiogram was planned. EKG despite complaints of palpitations was found to be normal.   Echo stress test was completed on 08/26/2013 and found to be negative for ischemia. There was no echocardiographic evidence for stress-induced ischemia with stress EKG negative for ischemia. EF was found to be 60-65% with normal LV function.   She comes today for discussion of echo report. She continues to have frequent palpitations and heart racing. She continues to wear cardiac monitor.           No Known Allergies  Current Outpatient Prescriptions  Medication Sig Dispense Refill  . ALPRAZolam (XANAX) 0.5 MG tablet Take 1 tablet (0.5 mg total) by mouth 3 (three) times daily as needed for sleep or anxiety (use half tablet during the day .Marland Kitchen..half or whole at bedtime prn).  30 tablet  0  . amLODipine (NORVASC) 10 MG tablet TAKE 1 TABLET BY MOUTH DAILY  30 tablet  5  . cholecalciferol (VITAMIN D) 1000 UNITS tablet Take 1,000 Units by mouth daily.      . DULoxetine (CYMBALTA) 60 MG capsule Take 60 mg by mouth daily.        . Fe Fum-FePoly-Vit C-Vit B3 (INTEGRA) 62.5-62.5-40-3 MG CAPS TAKE 1 CAPSULE BY MOUTH DAILY.  30 capsule  3  . Linaclotide (LINZESS) 290 MCG CAPS Take 1 capsule by mouth daily.  30 capsule  6  . losartan-hydrochlorothiazide (HYZAAR) 50-12.5 MG per tablet Take 1 tablet by mouth daily.       . Methotrexate Sodium (METHOTREXATE PO) Take 10 tablets by mouth once a week.      Marland Kitchen omeprazole (PRILOSEC) 20 MG capsule Take 20 mg by mouth daily as needed (indigestion).       No current facility-administered medications for this visit.    Past Medical History  Diagnosis Date  . Anxiety   . Hypertension   . High  cholesterol   . GERD (gastroesophageal reflux disease)   . Fibromyalgia   . Migraine   . Hypothyroidism   . History of anemia   . Celiac disease   . Migraine   . Fibromyalgia   . Scalp psoriasis     Past Surgical History  Procedure Laterality Date  . Tonsillectomy    . Endometrial ablation    . Esophagogastroduodenoscopy      ZOX:WRUEAV of systems complete and found to be negative unless listed above  PHYSICAL EXAM Ht 5\' 2"  (1.575 m)  Wt 160 lb 0.6 oz (72.594 kg)  BMI 29.26 kg/m2 General: Well developed, well nourished, in no acute distress Head: Eyes PERRLA, No xanthomas.   Normal cephalic and atramatic  Lungs: Clear bilaterally to auscultation and percussion. Heart: HRRR S1 S2, without MRG.  Pulses are 2+ & equal.            No carotid bruit. No JVD.  No abdominal bruits. No femoral bruits. Abdomen: Bowel sounds are positive, abdomen soft and non-tender without masses or                  Hernia's noted. Msk:  Back normal, normal gait. Normal strength and tone for age. Extremities: No clubbing, cyanosis or edema.  DP +1 Neuro: Alert and oriented X 3. Psych:  Good affect, responds appropriately  :  ASSESSMENT AND PLAN

## 2013-09-07 NOTE — Assessment & Plan Note (Signed)
She will start magnesium 400 mg daily. Taking off cardiac monitor today. This will be read and she will follow up with Dr. Diona Browner for discussion of results.

## 2013-09-09 ENCOUNTER — Other Ambulatory Visit: Payer: Self-pay | Admitting: *Deleted

## 2013-09-09 ENCOUNTER — Encounter: Payer: Self-pay | Admitting: Internal Medicine

## 2013-09-09 DIAGNOSIS — R000 Tachycardia, unspecified: Secondary | ICD-10-CM

## 2013-09-10 ENCOUNTER — Ambulatory Visit (HOSPITAL_COMMUNITY): Admission: RE | Admit: 2013-09-10 | Payer: 59 | Source: Ambulatory Visit

## 2013-09-13 ENCOUNTER — Ambulatory Visit: Payer: 59 | Admitting: Internal Medicine

## 2013-09-20 ENCOUNTER — Ambulatory Visit (INDEPENDENT_AMBULATORY_CARE_PROVIDER_SITE_OTHER): Payer: 59 | Admitting: Nurse Practitioner

## 2013-09-20 ENCOUNTER — Encounter: Payer: Self-pay | Admitting: Cardiology

## 2013-09-20 ENCOUNTER — Encounter: Payer: Self-pay | Admitting: Nurse Practitioner

## 2013-09-20 VITALS — BP 122/80 | Temp 99.1°F | Ht 62.0 in | Wt 161.0 lb

## 2013-09-20 DIAGNOSIS — L500 Allergic urticaria: Secondary | ICD-10-CM

## 2013-09-20 DIAGNOSIS — R21 Rash and other nonspecific skin eruption: Secondary | ICD-10-CM

## 2013-09-20 MED ORDER — CLOBETASOL PROPIONATE 0.05 % EX CREA: TOPICAL_CREAM | Freq: Two times a day (BID) | CUTANEOUS | Status: DC

## 2013-09-20 NOTE — Patient Instructions (Addendum)
Loratadine 10 mg in the morning Benadryl 25 mg in the evening Zantac 75-150 once a day

## 2013-09-21 ENCOUNTER — Encounter: Payer: Self-pay | Admitting: Family Medicine

## 2013-09-21 ENCOUNTER — Encounter: Payer: Self-pay | Admitting: Cardiology

## 2013-09-21 ENCOUNTER — Inpatient Hospital Stay (HOSPITAL_COMMUNITY): Admission: RE | Admit: 2013-09-21 | Payer: 59 | Source: Ambulatory Visit

## 2013-09-21 ENCOUNTER — Ambulatory Visit (INDEPENDENT_AMBULATORY_CARE_PROVIDER_SITE_OTHER): Payer: 59 | Admitting: Cardiology

## 2013-09-21 VITALS — BP 112/76 | HR 85 | Ht 62.0 in | Wt 159.0 lb

## 2013-09-21 DIAGNOSIS — I10 Essential (primary) hypertension: Secondary | ICD-10-CM

## 2013-09-21 DIAGNOSIS — R0600 Dyspnea, unspecified: Secondary | ICD-10-CM

## 2013-09-21 DIAGNOSIS — R0609 Other forms of dyspnea: Secondary | ICD-10-CM

## 2013-09-21 DIAGNOSIS — R002 Palpitations: Secondary | ICD-10-CM

## 2013-09-21 DIAGNOSIS — R0989 Other specified symptoms and signs involving the circulatory and respiratory systems: Secondary | ICD-10-CM

## 2013-09-21 NOTE — Assessment & Plan Note (Signed)
Sinus rhythm noted by recent monitoring, sinus tachycardia, rule out PSVT with similar morphology based on her description of symptoms. At this point would recommend lifestyle modifications, watching caffeine or other stimulants, also initiating an aerobic exercise regimen. We will then see her back over the next 3 months and decide if a trial of beta blocker therapy might be considered.

## 2013-09-21 NOTE — Patient Instructions (Addendum)
Your physician recommends that you schedule a follow-up appointment in: 3 MONTHS  Your physician recommends that you continue on your current medications as directed. Please refer to the Current Medication list given to you today.   

## 2013-09-21 NOTE — Assessment & Plan Note (Addendum)
Good blood pressure control today. I wonder if any of her feelings of leg edema and tightness are potentially side effect of Norvasc. Perhaps she could have this medicine down titrated over time. Keep followup with Dr. Gerda Diss.

## 2013-09-21 NOTE — Assessment & Plan Note (Signed)
Negative exercise echocardiogram. Will see if she notices any improvement with a basic exercise regimen.

## 2013-09-21 NOTE — Progress Notes (Signed)
Clinical Summary [name] is a 42 y.o.female seen in the office by [name] NP back in August and early September with symptoms of dyspnea on exertion and palpitations. Exercise echocardiogram was obtained demonstrating no evidence of ischemia with normal LVEF. She was also referred for cardiac monitoring demonstrating sinus rhythm and sinus tachycardia, no definite SVT but rapid heart rates from 130 to 160 noted. I reviewed these tracings.  [name] describes her symptoms as relatively sudden onset in terms of feeling of rapid heart rate. They can go away just as quickly. She has no definite dizziness or syncope. We discussed the possibility that she might be having PSVT that looks similar morphologically to sinus tachycardia.  Other complaint is of leg swelling and tightness. I suspect this may be a side effect of Norvasc. She is not reporting claudication symptoms, mainly feels this when she has been up on her feet for several hours.  We discussed lifestyle modification, regular aerobic exercise regimen as a first step in managing her feeling of palpitations. Did not want start a beta blocker at this time.   No Known Allergies  Current Outpatient Prescriptions  Medication Sig Dispense Refill  . ALPRAZolam (XANAX) 0.5 MG tablet Take 1 tablet (0.5 mg total) by mouth 3 (three) times daily as needed for sleep or anxiety (use half tablet during the day .Marland Kitchen..half or whole at bedtime prn).  30 tablet  0  . amLODipine (NORVASC) 10 MG tablet TAKE 1 TABLET BY MOUTH DAILY  30 tablet  5  . cholecalciferol (VITAMIN D) 1000 UNITS tablet Take 1,000 Units by mouth daily.      . clobetasol cream (TEMOVATE) 0.05 % Apply topically 2 (two) times daily.  30 g  0  . DULoxetine (CYMBALTA) 60 MG capsule Take 60 mg by mouth daily.        . Fe Fum-FePoly-Vit C-Vit B3 (INTEGRA) 62.5-62.5-40-3 MG CAPS TAKE 1 CAPSULE BY MOUTH DAILY.  30 capsule  3  . Linaclotide (LINZESS) 290 MCG CAPS Take 1 capsule by mouth  daily.  30 capsule  6  . losartan-hydrochlorothiazide (HYZAAR) 50-12.5 MG per tablet Take 1 tablet by mouth daily.       . Magnesium 400 MG CAPS Take 1 capsule by mouth daily.  90 capsule  3  . Methotrexate Sodium (METHOTREXATE PO) Take 10 tablets by mouth once a week.      Marland Kitchen omeprazole (PRILOSEC) 20 MG capsule Take 20 mg by mouth daily as needed (indigestion).       No current facility-administered medications for this visit.    Past Medical History  Diagnosis Date  . Anxiety   . Essential hypertension, benign   . High cholesterol   . GERD (gastroesophageal reflux disease)   . Fibromyalgia   . Migraine   . Hypothyroidism   . History of anemia   . Celiac disease   . Scalp psoriasis     Social History [name] reports that she has never smoked. She has never used smokeless tobacco. [name] reports that she does not drink alcohol.  Review of Systems Negative except as outlined.  Physical Examination Filed Vitals:   09/21/13 0952  BP: 112/76  Pulse: 85   Filed Weights   09/21/13 0952  Weight: 159 lb (72.122 kg)   Normally nourished, appears comfortable at rest. HEENT: Conjunctiva and lids normal, oropharynx clear. Neck: Supple, no elevated JVP or carotid bruits, no thyromegaly. Lungs: Clear to auscultation, nonlabored breathing at rest. Cardiac: Regular  rate and rhythm, no S3 or significant systolic murmur, no pericardial rub. Extremities: No pitting edema, distal pulses 2+.   Problem List and Plan   Heart palpitations Sinus rhythm noted by recent monitoring, sinus tachycardia, rule out PSVT with similar morphology based on her description of symptoms. At this point would recommend lifestyle modifications, watching caffeine or other stimulants, also initiating an aerobic exercise regimen. We will then see her back over the next 3 months and decide if a trial of beta blocker therapy might be considered.  DOE (dyspnea on exertion) Negative exercise echocardiogram.  Will see if she notices any improvement with a basic exercise regimen.  Essential hypertension, benign Good blood pressure control today. I wonder if any of her feelings of leg edema and tightness are potentially side effect of Norvasc. Perhaps she could have this medicine down titrated over time. Keep followup with Dr. Gerda Carroll.    Jonelle Sidle, M.D., F.A.C.C.

## 2013-09-23 ENCOUNTER — Encounter: Payer: Self-pay | Admitting: Nurse Practitioner

## 2013-09-23 NOTE — Progress Notes (Signed)
Subjective:  Presents for complaints of rash that began yesterday. Started off on her legs. Itching off-and-on on in different areas. Some itching on the arms today. Some hives at times. No difficulty breathing or swallowing. No known allergens. No known contacts.  Objective:   BP 122/80  Temp(Src) 99.1 F (37.3 C) (Oral)  Ht 5\' 2"  (1.575 m)  Wt 161 lb (73.029 kg)  BMI 29.44 kg/m2 NAD. Alert, oriented. Lungs clear. Heart regular rate rhythm. A few tiny mildly erythematous papules noted scattered on the arms and lower legs. A confluent pink minimally raised area noted on the right upper arm with some excoriation.  Assessment:Rash and nonspecific skin eruption  Allergic urticaria  Plan: Meds ordered this encounter  Medications  . clobetasol cream (TEMOVATE) 0.05 %    Sig: Apply topically 2 (two) times daily.    Dispense:  30 g    Refill:  0    Order Specific Question:  Supervising Provider    Answer:  Merlyn Albert [[id number]]   Loratadine 10 mg in the morning Benadryl 25 mg in the evening Zantac 75-150 once a day Recheck in 3-4 days if no improvement, sooner if worse. Consider Elimite treatment if no improvement.

## 2013-09-27 ENCOUNTER — Telehealth: Payer: Self-pay | Admitting: *Deleted

## 2013-09-27 ENCOUNTER — Other Ambulatory Visit: Payer: Self-pay | Admitting: Nurse Practitioner

## 2013-09-27 ENCOUNTER — Other Ambulatory Visit: Payer: Self-pay | Admitting: Gastroenterology

## 2013-09-27 MED ORDER — PERMETHRIN 5 % EX CREA: TOPICAL_CREAM | Freq: Once | CUTANEOUS | Status: DC

## 2013-09-27 NOTE — Telephone Encounter (Signed)
Recommend a one time treatment with Elimite cream as we discussed.  Apply from neck down, leave on for 8-10 hours then wash off. Let me know if persists.

## 2013-09-27 NOTE — Telephone Encounter (Signed)
I have sent in Rx for Elimite.

## 2013-09-27 NOTE — Telephone Encounter (Signed)
Discussed with patient

## 2013-09-27 NOTE — Telephone Encounter (Signed)
Patient states the bumps on her arm have returned and so has the itching-it got better but came back on both arms- is using the cream you prescribed and wants to avoid prednisone if possible. What do you advise.

## 2013-10-06 ENCOUNTER — Ambulatory Visit: Payer: 59 | Admitting: Internal Medicine

## 2013-10-07 ENCOUNTER — Ambulatory Visit (HOSPITAL_COMMUNITY)
Admission: RE | Admit: 2013-10-07 | Discharge: 2013-10-07 | Disposition: A | Discharge status: Home / Self Care | Payer: 59 | Source: Ambulatory Visit | Attending: Pulmonary Disease | Admitting: Pulmonary Disease

## 2013-10-07 DIAGNOSIS — R0600 Dyspnea, unspecified: Secondary | ICD-10-CM

## 2013-10-07 DIAGNOSIS — R0609 Other forms of dyspnea: Secondary | ICD-10-CM | POA: Insufficient documentation

## 2013-10-07 DIAGNOSIS — R0989 Other specified symptoms and signs involving the circulatory and respiratory systems: Secondary | ICD-10-CM | POA: Insufficient documentation

## 2013-10-07 MED ORDER — ALBUTEROL SULFATE (5 MG/ML) 0.5% IN NEBU
2.5000 mg | INHALATION_SOLUTION | Freq: Once | RESPIRATORY_TRACT | Status: AC
  Administered 2013-10-07: 2.5 mg via RESPIRATORY_TRACT

## 2013-10-08 ENCOUNTER — Ambulatory Visit: Payer: 59 | Admitting: Family Medicine

## 2013-10-08 ENCOUNTER — Other Ambulatory Visit: Payer: Self-pay | Admitting: *Deleted

## 2013-10-08 ENCOUNTER — Other Ambulatory Visit: Payer: Self-pay | Admitting: Family Medicine

## 2013-10-08 MED ORDER — LOSARTAN POTASSIUM-HCTZ 50-12.5 MG PO TABS: 1.0000 | ORAL_TABLET | Freq: Every day | ORAL | Status: DC

## 2013-10-11 NOTE — Procedures (Signed)
NAMEKAELENE, ELLISTON                  ACCOUNT NO.:  [id number]  MEDICAL RECORD NO.:  [id number]  LOCATION:                                 FACILITY:  PHYSICIAN:  Jshon Ibe L. Juanetta Gosling, M.D.DATE OF BIRTH:  Dec 14, 1971  DATE OF PROCEDURE:  10/08/2013 DATE OF DISCHARGE:                           PULMONARY FUNCTION TEST   Reason for pulmonary function testing is dyspnea on exertion. 1. Spirometry shows no ventilatory defect, and some reduction of     airflow in the mid range suggesting small airway disease. 2. Lung volumes are normal. 3. DLCO is normal. 4. Airway resistance is normal. 5. There is no significant bronchodilator improvement. 6. No definite cause of dyspnea on exertion is seen on this pulmonary     function test.     Ramon Dredge L. Juanetta Gosling, M.D.     ELH/MEDQ  D:  10/08/2013  T:  10/09/2013  Job:  [id number]  cc:   Barbaraann Share, MD,FCCP

## 2013-10-12 ENCOUNTER — Telehealth: Payer: Self-pay | Admitting: *Deleted

## 2013-10-12 LAB — PULMONARY FUNCTION TEST

## 2013-10-12 NOTE — Telephone Encounter (Signed)
PFT results have been received and placed in [facility] folder for review. Please advise, thanks.   

## 2013-10-14 ENCOUNTER — Other Ambulatory Visit: Payer: Self-pay | Admitting: Obstetrics and Gynecology

## 2013-10-14 ENCOUNTER — Telehealth: Payer: Self-pay | Admitting: Pulmonary Disease

## 2013-10-14 NOTE — Telephone Encounter (Signed)
Please let pt know that her breathing studies are completely normal.  Good news.  Do not see anything from a pulmonary standpoint to explain her shortness of breath.

## 2013-10-14 NOTE — Telephone Encounter (Signed)
ATC home # x 1 ATC x 1 cell #  (mailbox full) Sent MyChart Message to contact me back in regards to some results.

## 2013-10-18 ENCOUNTER — Encounter: Payer: Self-pay | Admitting: *Deleted

## 2013-10-18 ENCOUNTER — Other Ambulatory Visit: Payer: Self-pay | Admitting: Nurse Practitioner

## 2013-10-18 MED ORDER — ACYCLOVIR 400 MG PO TABS: 400.0000 mg | ORAL_TABLET | Freq: Two times a day (BID) | ORAL | Status: AC

## 2013-10-18 NOTE — Telephone Encounter (Signed)
Patient also wants to know if she can have refill on her zovirax to [facility] to deliver to [facility]

## 2013-10-18 NOTE — Telephone Encounter (Addendum)
ATC x 3 mailbox full, NA Multiple attempts to contact pt via phone and MyChart. Unsuccessful.  Pt has not read MyChart msg--letter being sent to pt home address to contact our office.

## 2013-10-18 NOTE — Telephone Encounter (Signed)
Please send in refills , 90 day with 1 year refill, to [facility]

## 2013-10-18 NOTE — Telephone Encounter (Signed)
Rebecca Carroll would like to get an allergy shot for today-her allergies are acting up terrible today. Can she do this?

## 2013-10-18 NOTE — Telephone Encounter (Signed)
Medication sent in to pharmacy. Patient was notified.  

## 2013-10-19 ENCOUNTER — Telehealth: Payer: Self-pay | Admitting: Family Medicine

## 2013-10-19 NOTE — Telephone Encounter (Signed)
Rebecca Carroll told [facility] yesterday that she was going to put order in for patient to get an allergy shot today. Has the order been put in and can we put her on the schedule?

## 2013-10-19 NOTE — Telephone Encounter (Signed)
This order came into my box. I just saw it at home. If Lorin Picket has not already approved, please give DepoMedrol 40 mg IM x one dose for rhinitis. Thanks.

## 2013-10-20 ENCOUNTER — Ambulatory Visit (INDEPENDENT_AMBULATORY_CARE_PROVIDER_SITE_OTHER): Payer: 59

## 2013-10-20 DIAGNOSIS — J310 Chronic rhinitis: Secondary | ICD-10-CM

## 2013-10-20 MED ORDER — METHYLPREDNISOLONE ACETATE 40 MG/ML IJ SUSP
40.0000 mg | Freq: Once | INTRAMUSCULAR | Status: AC
  Administered 2013-10-20: 40 mg via INTRAMUSCULAR

## 2013-10-20 NOTE — Telephone Encounter (Signed)
DepoMedrol 40 mg IM given in left deltoid. Patient tolerated without difficulty.

## 2013-10-21 ENCOUNTER — Other Ambulatory Visit: Payer: Self-pay | Admitting: Obstetrics and Gynecology

## 2013-10-21 ENCOUNTER — Ambulatory Visit (INDEPENDENT_AMBULATORY_CARE_PROVIDER_SITE_OTHER): Payer: 59 | Admitting: Otolaryngology

## 2013-10-21 DIAGNOSIS — H6990 Unspecified Eustachian tube disorder, unspecified ear: Secondary | ICD-10-CM

## 2013-10-21 DIAGNOSIS — H6980 Other specified disorders of Eustachian tube, unspecified ear: Secondary | ICD-10-CM

## 2013-10-21 DIAGNOSIS — R928 Other abnormal and inconclusive findings on diagnostic imaging of breast: Secondary | ICD-10-CM

## 2013-10-21 DIAGNOSIS — H902 Conductive hearing loss, unspecified: Secondary | ICD-10-CM

## 2013-10-27 ENCOUNTER — Telehealth: Payer: Self-pay | Admitting: Pulmonary Disease

## 2013-10-27 NOTE — Telephone Encounter (Signed)
Results of PFT have been explained to patient, pt expressed understanding. Nothing further needed.

## 2013-11-04 ENCOUNTER — Other Ambulatory Visit: Payer: Self-pay

## 2013-11-05 ENCOUNTER — Encounter: Payer: Self-pay | Admitting: Gastroenterology

## 2013-11-10 ENCOUNTER — Ambulatory Visit: Payer: 59 | Admitting: Internal Medicine

## 2013-11-12 ENCOUNTER — Telehealth: Payer: Self-pay | Admitting: Family Medicine

## 2013-11-12 DIAGNOSIS — E785 Hyperlipidemia, unspecified: Secondary | ICD-10-CM

## 2013-11-12 DIAGNOSIS — R5381 Other malaise: Secondary | ICD-10-CM

## 2013-11-12 DIAGNOSIS — R5383 Other fatigue: Secondary | ICD-10-CM

## 2013-11-12 NOTE — Telephone Encounter (Signed)
Patient needs [facility] paperwork °

## 2013-11-15 NOTE — Telephone Encounter (Signed)
Lipid,CBC, TSH,Ferritin

## 2013-11-15 NOTE — Telephone Encounter (Signed)
Blood work ordered in Epic. Patient was notified.  

## 2013-11-16 ENCOUNTER — Other Ambulatory Visit: Payer: 59

## 2013-11-19 ENCOUNTER — Ambulatory Visit
Admission: RE | Admit: 2013-11-19 | Discharge: 2013-11-19 | Disposition: A | Discharge status: Home / Self Care | Payer: 59 | Source: Ambulatory Visit | Attending: Obstetrics and Gynecology | Admitting: Obstetrics and Gynecology

## 2013-11-19 DIAGNOSIS — R928 Other abnormal and inconclusive findings on diagnostic imaging of breast: Secondary | ICD-10-CM

## 2013-11-19 LAB — CBC WITH DIFFERENTIAL/PLATELET
Basophils Absolute: 0 10*3/uL (ref 0.0–0.1)
Basophils Relative: 1 % (ref 0–1)
Eosinophils Absolute: 0.2 10*3/uL (ref 0.0–0.7)
Eosinophils Relative: 3 % (ref 0–5)
HCT: 34.7 % — ABNORMAL LOW (ref 36.0–46.0)
Hemoglobin: 12.1 g/dL (ref 12.0–15.0)
Lymphocytes Relative: 20 % (ref 12–46)
Lymphs Abs: 1.3 10*3/uL (ref 0.7–4.0)
MCH: 29.5 pg (ref 26.0–34.0)
MCHC: 34.9 g/dL (ref 30.0–36.0)
MCV: 84.6 fL (ref 78.0–100.0)
Monocytes Absolute: 0.4 10*3/uL (ref 0.1–1.0)
Monocytes Relative: 6 % (ref 3–12)
Neutro Abs: 4.6 10*3/uL (ref 1.7–7.7)
Neutrophils Relative %: 70 % (ref 43–77)
Platelets: 262 10*3/uL (ref 150–400)
RBC: 4.1 MIL/uL (ref 3.87–5.11)
RDW: 14 % (ref 11.5–15.5)
WBC: 6.5 10*3/uL (ref 4.0–10.5)

## 2013-11-19 LAB — LIPID PANEL
Cholesterol: 204 mg/dL — ABNORMAL HIGH (ref 0–200)
HDL: 49 mg/dL (ref >39)
LDL Cholesterol: 127 mg/dL — ABNORMAL HIGH (ref 0–99)
Total CHOL/HDL Ratio: 4.2 Ratio
Triglycerides: 142 mg/dL (ref <150)
VLDL: 28 mg/dL (ref 0–40)

## 2013-11-19 LAB — TSH: TSH: 3.268 u[IU]/mL (ref 0.350–4.500)

## 2013-11-19 LAB — FERRITIN: Ferritin: 40 ng/mL (ref 10–291)

## 2013-11-24 ENCOUNTER — Ambulatory Visit (INDEPENDENT_AMBULATORY_CARE_PROVIDER_SITE_OTHER): Payer: 59 | Admitting: Family Medicine

## 2013-11-24 ENCOUNTER — Encounter: Payer: Self-pay | Admitting: Family Medicine

## 2013-11-24 VITALS — BP 130/80 | Ht 62.0 in | Wt 159.0 lb

## 2013-11-24 DIAGNOSIS — I10 Essential (primary) hypertension: Secondary | ICD-10-CM

## 2013-11-24 MED ORDER — INTEGRA 62.5-62.5-40-3 MG PO CAPS: ORAL_CAPSULE | ORAL | Status: DC

## 2013-11-24 MED ORDER — ALPRAZOLAM 0.5 MG PO TABS: 0.5000 mg | ORAL_TABLET | Freq: Three times a day (TID) | ORAL | Status: DC | PRN

## 2013-11-24 NOTE — Patient Instructions (Signed)
DASH Diet  The DASH diet stands for "Dietary Approaches to Stop Hypertension." It is a healthy eating plan that has been shown to reduce high blood pressure (hypertension) in as little as 14 days, while also possibly providing other significant health benefits. These other health benefits include reducing the risk of breast cancer after menopause and reducing the risk of type 2 diabetes, heart disease, colon cancer, and stroke. Health benefits also include weight loss and slowing kidney failure in patients with chronic kidney disease.   DIET GUIDELINES  · Limit salt (sodium). Your diet should contain less than 1500 mg of sodium daily.  · Limit refined or processed carbohydrates. Your diet should include mostly whole grains. Desserts and added sugars should be used sparingly.  · Include small amounts of heart-healthy fats. These types of fats include nuts, oils, and tub margarine. Limit saturated and trans fats. These fats have been shown to be harmful in the body.  CHOOSING FOODS   The following food groups are based on a 2000 calorie diet. See your Registered Dietitian for individual calorie needs.  Grains and Grain Products (6 to 8 servings daily)  · Eat More Often: Whole-wheat bread, brown rice, whole-grain or wheat pasta, quinoa, popcorn without added fat or salt (air popped).  · Eat Less Often: White bread, white pasta, white rice, cornbread.  Vegetables (4 to 5 servings daily)  · Eat More Often: Fresh, frozen, and canned vegetables. Vegetables may be raw, steamed, roasted, or grilled with a minimal amount of fat.  · Eat Less Often/Avoid: Creamed or fried vegetables. Vegetables in a cheese sauce.  Fruit (4 to 5 servings daily)  · Eat More Often: All fresh, canned (in natural juice), or frozen fruits. Dried fruits without added sugar. One hundred percent fruit juice (½ cup [237 mL] daily).  · Eat Less Often: Dried fruits with added sugar. Canned fruit in light or heavy syrup.  Lean Meats, Fish, and Poultry (2  servings or less daily. One serving is 3 to 4 oz [85-114 g]).  · Eat More Often: Ninety percent or leaner ground beef, tenderloin, sirloin. Round cuts of beef, chicken breast, turkey breast. All fish. Grill, bake, or broil your meat. Nothing should be fried.  · Eat Less Often/Avoid: Fatty cuts of meat, turkey, or chicken leg, thigh, or wing. Fried cuts of meat or fish.  Dairy (2 to 3 servings)  · Eat More Often: Low-fat or fat-free milk, low-fat plain or light yogurt, reduced-fat or part-skim cheese.  · Eat Less Often/Avoid: Milk (whole, 2%). Whole milk yogurt. Full-fat cheeses.  Nuts, Seeds, and Legumes (4 to 5 servings per week)  · Eat More Often: All without added salt.  · Eat Less Often/Avoid: Salted nuts and seeds, canned beans with added salt.  Fats and Sweets (limited)  · Eat More Often: Vegetable oils, tub margarines without trans fats, sugar-free gelatin. Mayonnaise and salad dressings.  · Eat Less Often/Avoid: Coconut oils, palm oils, butter, stick margarine, cream, half and half, cookies, candy, pie.  FOR MORE INFORMATION  The Dash Diet Eating Plan: www.dashdiet.org  Document Released: 12/05/2011 Document Revised: 03/09/2012 Document Reviewed: 12/05/2011  [employer]® Patient Information ©2014 [employer].

## 2013-11-24 NOTE — Progress Notes (Signed)
   Subjective:    Patient ID: Rebecca Carroll, female    DOB: October 15, 1971, 42 y.o.   MRN: [id number]  HPIFollow up on amlodipine. Saw cardiologist for leg pain. He told her to cut med in half but she has stopped med.  Patient states overall she's doing pretty good she is trying to watch her dietary try to minimize salt and stay physically active. She denies any high blood pressure spells denies headaches chest pain shortness of breath. PMH relative hypertension family history hypertension ROS negative for shortness breath chest pain vomiting diarrhea   Review of Systems     Objective:   Physical Exam Lungs are clear hearts regular pulse normal blood pressure is good extremities no edema       Assessment & Plan:  History of elevated blood pressure now currently doing well no need for further medication. She is to continue taking Hyzaar as directed. Lab work annual.

## 2013-12-02 ENCOUNTER — Ambulatory Visit (INDEPENDENT_AMBULATORY_CARE_PROVIDER_SITE_OTHER): Payer: 59 | Admitting: Otolaryngology

## 2013-12-02 ENCOUNTER — Other Ambulatory Visit: Payer: 59

## 2013-12-02 DIAGNOSIS — J343 Hypertrophy of nasal turbinates: Secondary | ICD-10-CM

## 2013-12-02 DIAGNOSIS — H6980 Other specified disorders of Eustachian tube, unspecified ear: Secondary | ICD-10-CM

## 2013-12-16 ENCOUNTER — Other Ambulatory Visit: Payer: Self-pay | Admitting: Family Medicine

## 2013-12-20 ENCOUNTER — Encounter: Payer: Self-pay | Admitting: Family Medicine

## 2013-12-21 ENCOUNTER — Other Ambulatory Visit: Payer: Self-pay | Admitting: Family Medicine

## 2013-12-21 MED ORDER — BENZONATATE 100 MG PO CAPS: 100.0000 mg | ORAL_CAPSULE | Freq: Four times a day (QID) | ORAL | Status: DC | PRN

## 2014-01-27 ENCOUNTER — Other Ambulatory Visit: Payer: Self-pay | Admitting: *Deleted

## 2014-01-27 MED ORDER — HYDROCHLOROTHIAZIDE 25 MG PO TABS: 12.5000 mg | ORAL_TABLET | Freq: Every day | ORAL | Status: DC

## 2014-01-27 MED ORDER — LOSARTAN POTASSIUM 50 MG PO TABS: 50.0000 mg | ORAL_TABLET | Freq: Every day | ORAL | Status: DC

## 2014-01-31 ENCOUNTER — Encounter: Payer: Self-pay | Admitting: Family Medicine

## 2014-01-31 ENCOUNTER — Other Ambulatory Visit: Payer: Self-pay | Admitting: Family Medicine

## 2014-02-11 ENCOUNTER — Encounter: Payer: Self-pay | Admitting: Internal Medicine

## 2014-02-14 NOTE — Telephone Encounter (Signed)
Dr. Hilarie Fredrickson see message from the patient, linzess is causing nausea and diarrhea.  Is there an alternative you would like to try?

## 2014-02-16 MED ORDER — LUBIPROSTONE 8 MCG PO CAPS: 8.0000 ug | ORAL_CAPSULE | Freq: Two times a day (BID) | ORAL | Status: DC

## 2014-02-16 NOTE — Telephone Encounter (Signed)
Could try lubiprostone 8 mcg twice daily  Discontinue Linzess all together

## 2014-04-21 ENCOUNTER — Telehealth: Payer: Self-pay | Admitting: Family Medicine

## 2014-04-21 DIAGNOSIS — E039 Hypothyroidism, unspecified: Secondary | ICD-10-CM

## 2014-04-21 DIAGNOSIS — R5381 Other malaise: Secondary | ICD-10-CM

## 2014-04-21 DIAGNOSIS — R5383 Other fatigue: Secondary | ICD-10-CM

## 2014-04-21 DIAGNOSIS — E785 Hyperlipidemia, unspecified: Secondary | ICD-10-CM

## 2014-04-21 DIAGNOSIS — Z79899 Other long term (current) drug therapy: Secondary | ICD-10-CM

## 2014-04-21 NOTE — Telephone Encounter (Signed)
bloodwork orders ready. Pt notified.  

## 2014-04-21 NOTE — Addendum Note (Signed)
Addended by: Carmelina Noun on: 04/21/2014 02:08 PM   Modules accepted: Orders

## 2014-04-21 NOTE — Telephone Encounter (Signed)
Lipid, CBC, metabolic 7, TSH

## 2014-04-21 NOTE — Telephone Encounter (Signed)
Patient needs blood work papers and will make appointment in May after she does labs.

## 2014-05-14 LAB — LIPID PANEL
Cholesterol: 263 mg/dL — ABNORMAL HIGH (ref 0–200)
HDL: 44 mg/dL (ref >39)
LDL Cholesterol: 162 mg/dL — ABNORMAL HIGH (ref 0–99)
Total CHOL/HDL Ratio: 6 Ratio
Triglycerides: 287 mg/dL — ABNORMAL HIGH (ref <150)
VLDL: 57 mg/dL — ABNORMAL HIGH (ref 0–40)

## 2014-05-14 LAB — CBC WITH DIFFERENTIAL/PLATELET
Basophils Absolute: 0.1 10*3/uL (ref 0.0–0.1)
Basophils Relative: 1 % (ref 0–1)
Eosinophils Absolute: 0.2 10*3/uL (ref 0.0–0.7)
Eosinophils Relative: 4 % (ref 0–5)
HCT: 37.3 % (ref 36.0–46.0)
Hemoglobin: 12.9 g/dL (ref 12.0–15.0)
Lymphocytes Relative: 20 % (ref 12–46)
Lymphs Abs: 1.2 10*3/uL (ref 0.7–4.0)
MCH: 29.7 pg (ref 26.0–34.0)
MCHC: 34.6 g/dL (ref 30.0–36.0)
MCV: 85.9 fL (ref 78.0–100.0)
Monocytes Absolute: 0.5 10*3/uL (ref 0.1–1.0)
Monocytes Relative: 8 % (ref 3–12)
Neutro Abs: 3.9 10*3/uL (ref 1.7–7.7)
Neutrophils Relative %: 67 % (ref 43–77)
Platelets: 304 10*3/uL (ref 150–400)
RBC: 4.34 MIL/uL (ref 3.87–5.11)
RDW: 13.9 % (ref 11.5–15.5)
WBC: 5.8 10*3/uL (ref 4.0–10.5)

## 2014-05-14 LAB — BASIC METABOLIC PANEL
BUN: 9 mg/dL (ref 6–23)
CO2: 32 mEq/L (ref 19–32)
Calcium: 9.5 mg/dL (ref 8.4–10.5)
Chloride: 99 mEq/L (ref 96–112)
Creat: 0.72 mg/dL (ref 0.50–1.10)
Glucose, Bld: 91 mg/dL (ref 70–99)
Potassium: 3.5 mEq/L (ref 3.5–5.3)
Sodium: 139 mEq/L (ref 135–145)

## 2014-05-14 LAB — TSH: TSH: 3.122 u[IU]/mL (ref 0.350–4.500)

## 2014-05-15 ENCOUNTER — Encounter: Payer: Self-pay | Admitting: Family Medicine

## 2014-05-25 ENCOUNTER — Ambulatory Visit (INDEPENDENT_AMBULATORY_CARE_PROVIDER_SITE_OTHER): Payer: 59 | Admitting: Family Medicine

## 2014-05-25 ENCOUNTER — Encounter: Payer: Self-pay | Admitting: Family Medicine

## 2014-05-25 VITALS — BP 144/92 | Ht 62.0 in | Wt 161.0 lb

## 2014-05-25 DIAGNOSIS — I10 Essential (primary) hypertension: Secondary | ICD-10-CM

## 2014-05-25 MED ORDER — LOSARTAN POTASSIUM-HCTZ 100-12.5 MG PO TABS: 1.0000 | ORAL_TABLET | Freq: Every day | ORAL | Status: DC

## 2014-05-25 MED ORDER — ALPRAZOLAM 0.5 MG PO TABS: 0.5000 mg | ORAL_TABLET | Freq: Three times a day (TID) | ORAL | Status: AC | PRN

## 2014-05-25 MED ORDER — LEVOTHYROXINE SODIUM 50 MCG PO TABS: ORAL_TABLET | ORAL | Status: DC

## 2014-05-25 MED ORDER — DULOXETINE HCL 60 MG PO CPEP: ORAL_CAPSULE | ORAL | Status: DC

## 2014-05-25 MED ORDER — OMEPRAZOLE 20 MG PO CPDR: 20.0000 mg | DELAYED_RELEASE_CAPSULE | Freq: Every day | ORAL | Status: DC | PRN

## 2014-05-25 NOTE — Patient Instructions (Addendum)
DASH Diet The DASH diet stands for "Dietary Approaches to Stop Hypertension." It is a healthy eating plan that has been shown to reduce high blood pressure (hypertension) in as little as 14 days, while also possibly providing other significant health benefits. These other health benefits include reducing the risk of breast cancer after menopause and reducing the risk of type 2 diabetes, heart disease, colon cancer, and stroke. Health benefits also include weight loss and slowing kidney failure in patients with chronic kidney disease.  DIET GUIDELINES  Limit salt (sodium). Your diet should contain less than 1500 mg of sodium daily.  Limit refined or processed carbohydrates. Your diet should include mostly whole grains. Desserts and added sugars should be used sparingly.  Include small amounts of heart-healthy fats. These types of fats include nuts, oils, and tub margarine. Limit saturated and trans fats. These fats have been shown to be harmful in the body. CHOOSING FOODS  The following food groups are based on a 2000 calorie diet. See your Registered Dietitian for individual calorie needs. Grains and Grain Products (6 to 8 servings daily)  Eat More Often: Whole-wheat bread, brown rice, whole-grain or wheat pasta, quinoa, popcorn without added fat or salt (air popped).  Eat Less Often: White bread, white pasta, white rice, cornbread. Vegetables (4 to 5 servings daily)  Eat More Often: Fresh, frozen, and canned vegetables. Vegetables may be raw, steamed, roasted, or grilled with a minimal amount of fat.  Eat Less Often/Avoid: Creamed or fried vegetables. Vegetables in a cheese sauce. Fruit (4 to 5 servings daily)  Eat More Often: All fresh, canned (in natural juice), or frozen fruits. Dried fruits without added sugar. One hundred percent fruit juice ( cup [237 mL] daily).  Eat Less Often: Dried fruits with added sugar. Canned fruit in light or heavy syrup. [employer], Fish, and Poultry (2  servings or less daily. One serving is 3 to 4 oz [85-114 g]).  Eat More Often: Ninety percent or leaner ground beef, tenderloin, sirloin. Round cuts of beef, chicken breast, [place] breast. All fish. Grill, bake, or broil your meat. Nothing should be fried.  Eat Less Often/Avoid: Fatty cuts of meat, [place], or chicken leg, thigh, or wing. Fried cuts of meat or fish. Dairy (2 to 3 servings)  Eat More Often: Low-fat or fat-free milk, low-fat plain or light yogurt, reduced-fat or part-skim cheese.  Eat Less Often/Avoid: Milk (whole, 2%).Whole milk yogurt. Full-fat cheeses. Nuts, Seeds, and Legumes (4 to 5 servings per week)  Eat More Often: All without added salt.  Eat Less Often/Avoid: Salted nuts and seeds, canned beans with added salt. Fats and Sweets (limited)  Eat More Often: Vegetable oils, tub margarines without trans fats, sugar-free gelatin. Mayonnaise and salad dressings.  Eat Less Often/Avoid: Coconut oils, palm oils, butter, stick margarine, cream, half and half, cookies, candy, pie. FOR MORE INFORMATION The Dash Diet Eating Plan: www.dashdiet.org Document Released: 12/05/2011 Document Revised: 03/09/2012 Document Reviewed: 12/05/2011 [facility] Patient Information 2014 [place], [place].   Cholesterol Cholesterol is a white, waxy, fat-like protein needed by your body in small amounts. The liver makes all the cholesterol you need. It is carried from the liver by the blood through the blood vessels. Deposits (plaque) may build up on blood vessel walls. This makes the arteries narrower and stiffer. Plaque increases the risk for heart attack and stroke. You cannot feel your cholesterol level even if it is very high. The only way to know is by a blood test to check your  lipid (fats) levels. Once you know your cholesterol levels, you should keep a record of the test results. Work with your caregiver to to keep your levels in the desired range. WHAT THE RESULTS MEAN:  Total cholesterol  is a rough measure of all the cholesterol in your blood.  LDL is the so-called bad cholesterol. This is the type that deposits cholesterol in the walls of the arteries. You want this level to be low.  HDL is the good cholesterol because it cleans the arteries and carries the LDL away. You want this level to be high.  Triglycerides are fat that the body can either burn for energy or store. High levels are closely linked to heart disease. DESIRED LEVELS:  Total cholesterol below 200.  LDL below 100 for people at risk, below 70 for very high risk.  HDL above 50 is good, above 60 is best.  Triglycerides below 150. HOW TO LOWER YOUR CHOLESTEROL:  Diet.  Choose fish or white meat chicken and [place], roasted or baked. Limit fatty cuts of red meat, fried foods, and processed meats, such as sausage and lunch meat.  Eat lots of fresh fruits and vegetables. Choose whole grains, beans, pasta, potatoes and cereals.  Use only small amounts of olive, corn or canola oils. Avoid butter, mayonnaise, shortening or palm kernel oils. Avoid foods with trans-fats.  Use skim/nonfat milk and low-fat/nonfat yogurt and cheeses. Avoid whole milk, cream, ice cream, egg yolks and cheeses. Healthy desserts include angel food cake, ginger snaps, animal crackers, hard candy, popsicles, and low-fat/nonfat frozen yogurt. Avoid pastries, cakes, pies and cookies.  Exercise.  A regular program helps decrease LDL and raises HDL.  Helps with weight control.  Do things that increase your activity level like gardening, walking, or taking the stairs.  Medication.  May be prescribed by your caregiver to help lowering cholesterol and the risk for heart disease.  You may need medicine even if your levels are normal if you have several risk factors. HOME CARE INSTRUCTIONS   Follow your diet and exercise programs as suggested by your caregiver.  Take medications as directed.  Have blood work done when your caregiver  feels it is necessary. MAKE SURE YOU:   Understand these instructions.  Will watch your condition.  Will get help right away if you are not doing well or get worse. Document Released: 09/10/2001 Document Revised: 03/09/2012 Document Reviewed: 09/29/2013 [facility] Patient Information 2014 [place], [place]. Hypertension As your heart beats, it forces blood through your arteries. This force is your blood pressure. If the pressure is too high, it is called hypertension (HTN) or high blood pressure. HTN is dangerous because you may have it and not know it. High blood pressure may mean that your heart has to work harder to pump blood. Your arteries may be narrow or stiff. The extra work puts you at risk for heart disease, stroke, and other problems.  Blood pressure consists of two numbers, a higher number over a lower, 110/72, for example. It is stated as "110 over 72." The ideal is below 120 for the top number (systolic) and under 80 for the bottom (diastolic). Write down your blood pressure today. You should pay close attention to your blood pressure if you have certain conditions such as:  Heart failure.  Prior heart attack.  Diabetes  Chronic kidney disease.  Prior stroke.  Multiple risk factors for heart disease. To see if you have HTN, your blood pressure should be measured while you are seated  with your arm held at the level of the heart. It should be measured at least twice. A one-time elevated blood pressure reading (especially in the Emergency Department) does not mean that you need treatment. There may be conditions in which the blood pressure is different between your right and left arms. It is important to see your caregiver soon for a recheck. Most people have essential hypertension which means that there is not a specific cause. This type of high blood pressure may be lowered by changing lifestyle factors such as:  Stress.  Smoking.  Lack of exercise.  Excessive  weight.  Drug/tobacco/alcohol use.  Eating less salt. Most people do not have symptoms from high blood pressure until it has caused damage to the body. Effective treatment can often prevent, delay or reduce that damage. TREATMENT  When a cause has been identified, treatment for high blood pressure is directed at the cause. There are a large number of medications to treat HTN. These fall into several categories, and your caregiver will help you select the medicines that are best for you. Medications may have side effects. You should review side effects with your caregiver. If your blood pressure stays high after you have made lifestyle changes or started on medicines,   Your medication(s) may need to be changed.  Other problems may need to be addressed.  Be certain you understand your prescriptions, and know how and when to take your medicine.  Be sure to follow up with your caregiver within the time frame advised (usually within two weeks) to have your blood pressure rechecked and to review your medications.  If you are taking more than one medicine to lower your blood pressure, make sure you know how and at what times they should be taken. Taking two medicines at the same time can result in blood pressure that is too low. SEEK IMMEDIATE MEDICAL CARE IF:  You develop a severe headache, blurred or changing vision, or confusion.  You have unusual weakness or numbness, or a faint feeling.  You have severe chest or abdominal pain, vomiting, or breathing problems. MAKE SURE YOU:   Understand these instructions.  Will watch your condition.  Will get help right away if you are not doing well or get worse. Document Released: 12/16/2005 Document Revised: 03/09/2012 Document Reviewed: 08/05/2008 [facility] Patient Information 2014 [place].   Thyroid- use 1 and 1/2 on M W F and 1 on other days  OV and labs in 6 months  Follow up BP in 3 to 4 weeks

## 2014-05-25 NOTE — Progress Notes (Signed)
   Subjective:    Patient ID: Rebecca Carroll, female    DOB: May 04, 1971, 43 y.o.   MRN: [id number]  HPIHypertension check up. Would like to discuss changing losartan to losartan with hctz.  Follow up on bloodwork. Discussed the bloodwork in detail. She denies any particular troubles currently  Review of Systems  Constitutional: Negative for activity change, appetite change and fatigue.  HENT: Negative for congestion.   Respiratory: Negative for cough, choking and shortness of breath.   Cardiovascular: Negative for chest pain.  Gastrointestinal: Negative for abdominal pain.  Endocrine: Negative for polydipsia and polyphagia.  Genitourinary: Negative for frequency.  Neurological: Negative for weakness.  Psychiatric/Behavioral: Negative for confusion.       Objective:   Physical Exam  Vitals reviewed. Constitutional: She appears well-nourished. No distress.  Cardiovascular: Normal rate, regular rhythm and normal heart sounds.   No murmur heard. Pulmonary/Chest: Effort normal and breath sounds normal. No respiratory distress.  Musculoskeletal: She exhibits no edema.  Lymphadenopathy:    She has no cervical adenopathy.  Neurological: She is alert. She exhibits normal muscle tone.  Psychiatric: Her behavior is normal.          Assessment & Plan:  #1 HTN adjust medication. Now using Hyzaar patient will check her blood pressure and notify us if it is not at goal significant time spent with patient discussing proper diet and exercise  #2 Will adjust her thyroid medicine try to get TSH closer to 2.0 repeat TSH in 3-4 months  #3 patient not anemic.  Followup 6 months for a full office visit followup in 3 weeks to recheck blood pressure

## 2014-06-06 ENCOUNTER — Ambulatory Visit (INDEPENDENT_AMBULATORY_CARE_PROVIDER_SITE_OTHER): Payer: 59 | Admitting: Family Medicine

## 2014-06-06 ENCOUNTER — Encounter: Payer: Self-pay | Admitting: Family Medicine

## 2014-06-06 VITALS — BP 140/90 | Temp 98.2°F | Ht 62.0 in | Wt 161.0 lb

## 2014-06-06 DIAGNOSIS — L0291 Cutaneous abscess, unspecified: Secondary | ICD-10-CM

## 2014-06-06 DIAGNOSIS — L0390 Cellulitis, unspecified: Secondary | ICD-10-CM

## 2014-06-06 DIAGNOSIS — T148XXA Other injury of unspecified body region, initial encounter: Secondary | ICD-10-CM

## 2014-06-06 DIAGNOSIS — Z23 Encounter for immunization: Secondary | ICD-10-CM

## 2014-06-06 MED ORDER — CLINDAMYCIN HCL 300 MG PO CAPS: 300.0000 mg | ORAL_CAPSULE | Freq: Three times a day (TID) | ORAL | Status: DC

## 2014-06-06 NOTE — Progress Notes (Signed)
   Subjective:    Patient ID: Rebecca Carroll, female    DOB: [birth year], 43 y.o.   MRN: [id number]  HPI Patient is here today because she was attacked by her rooster yesterday and she has two puncture wounds on the side of her left thigh. Redness, edema and tenderness noted.   Patient has no other concerns at this time.  Unfortunately she got tangled up with her rooster this causes significant puncture injuries. She denies other trouble  Review of Systems She relates severe pain tenderness soreness around the site denies pain in other places denies high fever chills sweats nausea vomiting    Objective:   Physical Exam Has 2 puncture wounds on the side of her thigh one is red with tenderness the other one is just tender without redness there is no exudate draining from it. Does have a little bit of blood draining from it with a Band-Aid appropriately placed there is no other signs of significant infection in the leg.       Assessment & Plan:  Puncture wound with cellulitis around one of the puncture wounds the mechanism of injury as well as infection in way to treat it was discussed in detail. So therefore we'll pursue forward with warm compresses antibiotics and a followup if not improving over the next 48-72 hours warning signs discussed  15 minutes spent with patient discussing these issues and treating her

## 2014-06-17 ENCOUNTER — Ambulatory Visit: Payer: 59 | Admitting: Family Medicine

## 2014-09-02 ENCOUNTER — Encounter: Payer: Self-pay | Admitting: Family Medicine

## 2014-09-28 ENCOUNTER — Ambulatory Visit: Payer: 59 | Admitting: Nurse Practitioner

## 2014-10-21 ENCOUNTER — Encounter: Payer: Self-pay | Admitting: Nurse Practitioner

## 2014-10-21 ENCOUNTER — Ambulatory Visit (INDEPENDENT_AMBULATORY_CARE_PROVIDER_SITE_OTHER): Payer: 59 | Admitting: Nurse Practitioner

## 2014-10-21 VITALS — BP 130/90 | Ht 62.0 in | Wt 166.0 lb

## 2014-10-21 DIAGNOSIS — J3089 Other allergic rhinitis: Secondary | ICD-10-CM

## 2014-10-21 DIAGNOSIS — E039 Hypothyroidism, unspecified: Secondary | ICD-10-CM

## 2014-10-21 DIAGNOSIS — M6248 Contracture of muscle, other site: Secondary | ICD-10-CM

## 2014-10-21 DIAGNOSIS — K219 Gastro-esophageal reflux disease without esophagitis: Secondary | ICD-10-CM

## 2014-10-21 DIAGNOSIS — M62838 Other muscle spasm: Secondary | ICD-10-CM

## 2014-10-21 LAB — THYROID PANEL WITH TSH
Free Thyroxine Index: 1.8 (ref 1.4–3.8)
T3 Uptake: 25 % (ref 22.0–35.0)
T4, Total: 7 ug/dL (ref 4.5–12.0)
TSH: 2.504 u[IU]/mL (ref 0.350–4.500)

## 2014-10-24 ENCOUNTER — Encounter: Payer: Self-pay | Admitting: Nurse Practitioner

## 2014-10-24 NOTE — Progress Notes (Signed)
Subjective:  Presents for c/o pain in the front of the neck area more towards the base of the throat. Had a fever last week which has resolved. Having break through reflux symptoms on daily Omeprazole. Pressure in the right ear. No sore throat, cough or headache. No difficulty swallowing or choking.   Objective:   BP 130/90  Ht 5\' 2"  (1.575 m)  Wt 166 lb (75.297 kg)  BMI 30.35 kg/m2 NAD. Alert, oriented. TMs mild clear effusion. Pharynx clear. Neck supple with minimal anterior adenopathy. Very tight tender muscles noted in the lateral neck bilat. Tenderness around the thyroid. No masses or goiter noted. Abdomen soft, non distended with mild epigastric area tenderness.   Assessment:  Problem List Items Addressed This Visit     Respiratory   Allergic rhinitis     Digestive   GERD (gastroesophageal reflux disease)     Endocrine   Hypothyroidism - Primary   Relevant Orders      Thyroid Panel With TSH (Completed)      Thyroid stimulating immunoglobulin (Completed)      Thyroid Peroxidase Antibody    Other Visit Diagnoses   Muscle spasms of neck          Plan: ice/heat to neck area. OTC TENS unit; massage to neck area. Increase Omeprazole to BID. Follow up with GI specialist if persists. OTC meds as directed for congestion. Call back if worsens or persists.

## 2014-10-25 ENCOUNTER — Other Ambulatory Visit: Payer: Self-pay | Admitting: Obstetrics and Gynecology

## 2014-10-25 ENCOUNTER — Telehealth: Payer: Self-pay | Admitting: Nurse Practitioner

## 2014-10-25 DIAGNOSIS — E785 Hyperlipidemia, unspecified: Secondary | ICD-10-CM

## 2014-10-25 NOTE — Telephone Encounter (Signed)
Please review lab results sent from patient in yellow folder. Please send to [facility].

## 2014-10-26 ENCOUNTER — Encounter: Payer: Self-pay | Admitting: Nurse Practitioner

## 2014-10-26 LAB — THYROID STIMULATING IMMUNOGLOBULIN: TSI: 27 % baseline (ref <140)

## 2014-10-31 MED ORDER — POTASSIUM CHLORIDE ER 10 MEQ PO TBCR: 10.0000 meq | EXTENDED_RELEASE_TABLET | Freq: Every day | ORAL | Status: DC

## 2014-10-31 NOTE — Telephone Encounter (Signed)
Notified patient repeat lipid profile. The numbers were not good on last one. Much worse than the one 6 months before. She is running right at the border of normal and sometimes just below for her potassium. Although her BP pill is not a strong medicine, it can have an effect on potassium over time. Recommend KDur 10 meq one po qd (90 1 RF) if she agrees. Order for lipid in the system and medication sent to pharmacy.

## 2014-10-31 NOTE — Telephone Encounter (Signed)
I reviewed labs from dermatology as well as recent labs on Epic. A few recommendations:  1. Repeat lipid profile. The numbers were not good on last one. Much worse than the one 6 months before.  2. She is running right at the border of normal and sometimes just below for her potassium. Although her BP pill is not a strong medicine, it can have an effect on potassium over time. I recommend KDur 10 meq one po qd (90 1 RF) if she agrees.

## 2014-11-01 LAB — CYTOLOGY - PAP

## 2014-11-12 LAB — LIPID PANEL
Cholesterol: 254 mg/dL — ABNORMAL HIGH (ref 0–200)
HDL: 46 mg/dL (ref >39)
LDL Cholesterol: 148 mg/dL — ABNORMAL HIGH (ref 0–99)
Total CHOL/HDL Ratio: 5.5 Ratio
Triglycerides: 298 mg/dL — ABNORMAL HIGH (ref <150)
VLDL: 60 mg/dL — ABNORMAL HIGH (ref 0–40)

## 2015-01-27 ENCOUNTER — Encounter: Payer: Self-pay | Admitting: Family Medicine

## 2015-01-27 ENCOUNTER — Other Ambulatory Visit: Payer: Self-pay | Admitting: Family Medicine

## 2015-01-27 NOTE — Telephone Encounter (Signed)
May have 90 day prescription with one refill will need chronic health visit follow-up within the next 4-5 months

## 2015-04-11 ENCOUNTER — Encounter: Payer: Self-pay | Admitting: Family Medicine

## 2015-04-11 DIAGNOSIS — E785 Hyperlipidemia, unspecified: Secondary | ICD-10-CM

## 2015-04-11 DIAGNOSIS — E039 Hypothyroidism, unspecified: Secondary | ICD-10-CM

## 2015-04-11 DIAGNOSIS — Z13820 Encounter for screening for osteoporosis: Secondary | ICD-10-CM

## 2015-04-11 DIAGNOSIS — I10 Essential (primary) hypertension: Secondary | ICD-10-CM

## 2015-04-11 DIAGNOSIS — Z79899 Other long term (current) drug therapy: Secondary | ICD-10-CM

## 2015-04-12 NOTE — Addendum Note (Signed)
Addended byCharolotte Capuchin D on: 04/12/2015 08:31 AM   Modules accepted: Orders

## 2015-04-12 NOTE — Telephone Encounter (Signed)
It would be fine to order these blood tests add a metabolic 7. Patient to get lab work and follow-up at the end of the week even if she gets the blood work done on Thursday the results would be back by Friday

## 2015-04-13 ENCOUNTER — Encounter: Payer: Self-pay | Admitting: Nurse Practitioner

## 2015-04-14 ENCOUNTER — Ambulatory Visit: Payer: 59 | Admitting: Nurse Practitioner

## 2015-05-19 ENCOUNTER — Other Ambulatory Visit: Payer: Self-pay | Admitting: *Deleted

## 2015-05-19 LAB — LIPID PANEL
Chol/HDL Ratio: 4.7 ratio units — ABNORMAL HIGH (ref 0.0–4.4)
Cholesterol, Total: 252 mg/dL — ABNORMAL HIGH (ref 100–199)
HDL: 54 mg/dL (ref >39)
LDL Calculated: 155 mg/dL — ABNORMAL HIGH (ref 0–99)
Triglycerides: 217 mg/dL — ABNORMAL HIGH (ref 0–149)
VLDL Cholesterol Cal: 43 mg/dL — ABNORMAL HIGH (ref 5–40)

## 2015-05-19 LAB — BASIC METABOLIC PANEL
BUN/Creatinine Ratio: 18 (ref 9–23)
BUN: 12 mg/dL (ref 6–24)
CO2: 28 mmol/L (ref 18–29)
Calcium: 9.7 mg/dL (ref 8.7–10.2)
Chloride: 98 mmol/L (ref 97–108)
Creatinine, Ser: 0.66 mg/dL (ref 0.57–1.00)
GFR calc Af Amer: 123 mL/min/{1.73_m2} (ref >59)
GFR calc non Af Amer: 106 mL/min/{1.73_m2} (ref >59)
Glucose: 93 mg/dL (ref 65–99)
Potassium: 3.8 mmol/L (ref 3.5–5.2)
Sodium: 141 mmol/L (ref 134–144)

## 2015-05-19 LAB — VITAMIN D 25 HYDROXY (VIT D DEFICIENCY, FRACTURES): Vit D, 25-Hydroxy: 16.7 ng/mL — ABNORMAL LOW (ref 30.0–100.0)

## 2015-05-19 LAB — HEPATIC FUNCTION PANEL
ALT: 40 IU/L — ABNORMAL HIGH (ref 0–32)
AST: 31 IU/L (ref 0–40)
Albumin: 4.4 g/dL (ref 3.5–5.5)
Alkaline Phosphatase: 75 IU/L (ref 39–117)
Bilirubin Total: 0.6 mg/dL (ref 0.0–1.2)
Bilirubin, Direct: 0.11 mg/dL (ref 0.00–0.40)
Total Protein: 7.4 g/dL (ref 6.0–8.5)

## 2015-05-19 LAB — TSH: TSH: 7.76 u[IU]/mL — ABNORMAL HIGH (ref 0.450–4.500)

## 2015-05-19 MED ORDER — LEVOTHYROXINE SODIUM 75 MCG PO TABS: ORAL_TABLET | ORAL | Status: DC

## 2015-05-31 ENCOUNTER — Encounter: Payer: Self-pay | Admitting: Family Medicine

## 2015-05-31 NOTE — Telephone Encounter (Signed)
Nurses to call sean and set up time to come by for UDT, not to put reason for nurse visit ( or call it BP check if need be) of course tell sean we can only do UDT if pt consents

## 2015-06-02 ENCOUNTER — Other Ambulatory Visit: Payer: Self-pay | Admitting: Family Medicine

## 2015-06-09 ENCOUNTER — Ambulatory Visit (INDEPENDENT_AMBULATORY_CARE_PROVIDER_SITE_OTHER): Payer: 59 | Admitting: Family Medicine

## 2015-06-09 ENCOUNTER — Encounter: Payer: Self-pay | Admitting: Family Medicine

## 2015-06-09 VITALS — BP 128/88 | Ht 62.0 in | Wt 166.2 lb

## 2015-06-09 DIAGNOSIS — E669 Obesity, unspecified: Secondary | ICD-10-CM | POA: Insufficient documentation

## 2015-06-09 DIAGNOSIS — E559 Vitamin D deficiency, unspecified: Secondary | ICD-10-CM

## 2015-06-09 DIAGNOSIS — E785 Hyperlipidemia, unspecified: Secondary | ICD-10-CM | POA: Diagnosis not present

## 2015-06-09 DIAGNOSIS — E038 Other specified hypothyroidism: Secondary | ICD-10-CM

## 2015-06-09 DIAGNOSIS — I10 Essential (primary) hypertension: Secondary | ICD-10-CM | POA: Diagnosis not present

## 2015-06-09 DIAGNOSIS — R740 Nonspecific elevation of levels of transaminase and lactic acid dehydrogenase [LDH]: Secondary | ICD-10-CM

## 2015-06-09 DIAGNOSIS — R7401 Elevation of levels of liver transaminase levels: Secondary | ICD-10-CM | POA: Insufficient documentation

## 2015-06-09 MED ORDER — LOSARTAN POTASSIUM-HCTZ 100-12.5 MG PO TABS: 1.0000 | ORAL_TABLET | Freq: Every day | ORAL | Status: DC

## 2015-06-09 MED ORDER — DULOXETINE HCL 60 MG PO CPEP: 60.0000 mg | ORAL_CAPSULE | Freq: Every day | ORAL | Status: DC

## 2015-06-09 MED ORDER — POTASSIUM CHLORIDE ER 10 MEQ PO TBCR: 10.0000 meq | EXTENDED_RELEASE_TABLET | Freq: Every day | ORAL | Status: DC

## 2015-06-09 MED ORDER — VITAMIN D (ERGOCALCIFEROL) 1.25 MG (50000 UNIT) PO CAPS: 50000.0000 [IU] | ORAL_CAPSULE | ORAL | Status: DC

## 2015-06-09 NOTE — Progress Notes (Signed)
   Subjective:    Patient ID: Rebecca Carroll, female    DOB: [birth year], 44 y.o.   MRN: [id number]  Hyperlipidemia This is a chronic problem. The current episode started more than 1 year ago. The problem is uncontrolled. Recent lipid tests were reviewed and are high. There are no known factors aggravating her hyperlipidemia. Pertinent negatives include no chest pain. She is currently on no antihyperlipidemic treatment. The current treatment provides no improvement of lipids. There are no compliance problems.  There are no known risk factors for coronary artery disease.  Thyroid Problem Presents for follow-up visit. Symptoms include fatigue. Patient reports no anxiety, cold intolerance, heat intolerance, hoarse voice or palpitations. The symptoms have been improving. The treatment provided mild relief. Her past medical history is significant for hyperlipidemia.  Hypertension This is a chronic problem. The current episode started more than 1 year ago. The problem is unchanged. The problem is controlled. Pertinent negatives include no blurred vision, chest pain, neck pain, orthopnea or palpitations. Risk factors for coronary artery disease include dyslipidemia and family history. Past treatments include angiotensin blockers. The current treatment provides significant improvement. There are no compliance problems.  Hypertensive end-organ damage includes a thyroid problem.   Patient states that she has no other concerns at this time.    Review of Systems  Constitutional: Positive for fatigue. Negative for activity change and appetite change.  HENT: Negative for congestion and hoarse voice.   Eyes: Negative for blurred vision.  Respiratory: Negative for cough.   Cardiovascular: Negative for chest pain, palpitations and orthopnea.  Gastrointestinal: Negative for abdominal pain.  Endocrine: Negative for cold intolerance, heat intolerance, polydipsia and polyphagia.  Musculoskeletal: Negative for neck  pain.  Neurological: Negative for weakness.  Psychiatric/Behavioral: Negative for confusion. The patient is not nervous/anxious.        Objective:   Physical Exam  Constitutional: She appears well-nourished. No distress.  Cardiovascular: Normal rate, regular rhythm and normal heart sounds.   No murmur heard. Pulmonary/Chest: Effort normal and breath sounds normal. No respiratory distress.  Musculoskeletal: She exhibits no edema.  Lymphadenopathy:    She has no cervical adenopathy.  Neurological: She is alert. She exhibits normal muscle tone.  Psychiatric: Her behavior is normal.  Vitals reviewed.         Assessment & Plan:  1. Hyperlipemia Patient's cholesterol moderately elevated her risk percentage risk for current studies 1.8%. Will not start statins currently. Recheck in 6 months healthy diet recommended  2. Essential hypertension Blood pressure under very good control continue current measures  3. Other specified hypothyroidism Just recently increased dose recheck lab work in 6 weeks May need to keep increasing to get TSH between 0.4 and 2.0 - TSH - T4, free  4. Vitamin D deficiency 50,000 vitamin D weekly for the next 8 weeks then 2000 daily recheck lab work in 6 months  5. Obesity Patient encouraged to lose weight, diet and exercise discussed  6. Elevated transaminase level Probably related to fatty liver monitor

## 2015-06-09 NOTE — Patient Instructions (Signed)
In 6 months repeat lab work and office visit.  After completing the Vit D 50,000 units take Vit D 2,000 units daily

## 2015-06-22 ENCOUNTER — Other Ambulatory Visit: Payer: Self-pay | Admitting: Family Medicine

## 2015-08-11 ENCOUNTER — Other Ambulatory Visit: Payer: Self-pay | Admitting: Family Medicine

## 2015-12-19 ENCOUNTER — Telehealth: Payer: Self-pay | Admitting: Internal Medicine

## 2015-12-19 NOTE — Telephone Encounter (Signed)
Pt states she took one dose of miralax but it is not helping with her constipation. States her stool is thin and she has some pressure in her lower back/hip area and when she does go to the bathroom the pressure resolves. Discussed with pt that she can try up to 3 doses of the miralax to have BM. Pt verbalized understanding.

## 2016-01-02 ENCOUNTER — Other Ambulatory Visit: Payer: Self-pay | Admitting: Obstetrics and Gynecology

## 2016-01-02 DIAGNOSIS — Z6831 Body mass index (BMI) 31.0-31.9, adult: Secondary | ICD-10-CM | POA: Diagnosis not present

## 2016-01-02 DIAGNOSIS — Z01419 Encounter for gynecological examination (general) (routine) without abnormal findings: Secondary | ICD-10-CM | POA: Diagnosis not present

## 2016-01-02 DIAGNOSIS — Z124 Encounter for screening for malignant neoplasm of cervix: Secondary | ICD-10-CM | POA: Diagnosis not present

## 2016-01-02 DIAGNOSIS — Z1231 Encounter for screening mammogram for malignant neoplasm of breast: Secondary | ICD-10-CM | POA: Diagnosis not present

## 2016-01-03 LAB — CYTOLOGY - PAP

## 2016-01-19 MED FILL — METHOTREXATE 2.5 MG TABLET: 2.5 | 28 days supply | Qty: 24 | Fill #1

## 2016-01-22 DIAGNOSIS — E038 Other specified hypothyroidism: Secondary | ICD-10-CM | POA: Diagnosis not present

## 2016-01-22 DIAGNOSIS — I10 Essential (primary) hypertension: Secondary | ICD-10-CM | POA: Diagnosis not present

## 2016-01-22 DIAGNOSIS — E559 Vitamin D deficiency, unspecified: Secondary | ICD-10-CM | POA: Diagnosis not present

## 2016-01-22 DIAGNOSIS — E784 Other hyperlipidemia: Secondary | ICD-10-CM | POA: Diagnosis not present

## 2016-01-22 DIAGNOSIS — Z0000 Encounter for general adult medical examination without abnormal findings: Secondary | ICD-10-CM | POA: Diagnosis not present

## 2016-01-24 ENCOUNTER — Telehealth: Payer: 59 | Admitting: Family

## 2016-01-24 DIAGNOSIS — J019 Acute sinusitis, unspecified: Secondary | ICD-10-CM | POA: Diagnosis not present

## 2016-01-24 MED ORDER — AMOXICILLIN-POT CLAVULANATE 875-125 MG PO TABS: 1.0000 | ORAL_TABLET | Freq: Two times a day (BID) | ORAL | Status: DC

## 2016-01-24 MED FILL — AMOX TR-K CLV 875-125 MG TA: 875-125 | 7 days supply | Qty: 14 | Fill #0

## 2016-01-24 NOTE — Progress Notes (Signed)

## 2016-01-29 DIAGNOSIS — D6489 Other specified anemias: Secondary | ICD-10-CM | POA: Diagnosis not present

## 2016-01-29 DIAGNOSIS — M7071 Other bursitis of hip, right hip: Secondary | ICD-10-CM | POA: Diagnosis not present

## 2016-01-29 DIAGNOSIS — R945 Abnormal results of liver function studies: Secondary | ICD-10-CM | POA: Diagnosis not present

## 2016-01-29 DIAGNOSIS — R51 Headache: Secondary | ICD-10-CM | POA: Diagnosis not present

## 2016-01-29 DIAGNOSIS — Z0000 Encounter for general adult medical examination without abnormal findings: Secondary | ICD-10-CM | POA: Diagnosis not present

## 2016-01-29 DIAGNOSIS — E784 Other hyperlipidemia: Secondary | ICD-10-CM | POA: Diagnosis not present

## 2016-01-29 DIAGNOSIS — E038 Other specified hypothyroidism: Secondary | ICD-10-CM | POA: Diagnosis not present

## 2016-01-29 DIAGNOSIS — I10 Essential (primary) hypertension: Secondary | ICD-10-CM | POA: Diagnosis not present

## 2016-01-29 DIAGNOSIS — Z1389 Encounter for screening for other disorder: Secondary | ICD-10-CM | POA: Diagnosis not present

## 2016-01-29 MED FILL — LEVOTHYROXINE 88 MCG TABLET: 88 | 90 days supply | Qty: 90 | Fill #0

## 2016-01-29 MED FILL — ROSUVASTATIN CALCIUM 10 MG: 10 | 90 days supply | Qty: 90 | Fill #1

## 2016-02-14 DIAGNOSIS — L409 Psoriasis, unspecified: Secondary | ICD-10-CM | POA: Diagnosis not present

## 2016-02-14 DIAGNOSIS — Z79899 Other long term (current) drug therapy: Secondary | ICD-10-CM | POA: Diagnosis not present

## 2016-02-14 DIAGNOSIS — L281 Prurigo nodularis: Secondary | ICD-10-CM | POA: Diagnosis not present

## 2016-03-04 MED FILL — LOSARTAN-HCTZ 100-25 MG TAB: 100-25 | 90 days supply | Qty: 90 | Fill #2

## 2016-03-04 MED FILL — METHOTREXATE 2.5 MG TABLET: 2.5 | 28 days supply | Qty: 40 | Fill #0

## 2016-04-11 ENCOUNTER — Other Ambulatory Visit: Payer: Self-pay | Admitting: Family Medicine

## 2016-04-11 MED FILL — DULoxetine HCL 60 MG CPEP: 60 | 90 days supply | Qty: 90 | Fill #0

## 2016-05-20 MED FILL — ROSUVASTATIN CALCIUM 10 MG: 10 | 90 days supply | Qty: 90 | Fill #0

## 2016-05-20 MED FILL — LEVOTHYROXINE 88 MCG TABLET: 88 | 90 days supply | Qty: 90 | Fill #1

## 2016-05-21 DIAGNOSIS — L409 Psoriasis, unspecified: Secondary | ICD-10-CM | POA: Diagnosis not present

## 2016-05-21 DIAGNOSIS — Z79899 Other long term (current) drug therapy: Secondary | ICD-10-CM | POA: Diagnosis not present

## 2016-05-29 MED FILL — HUMIRA PEN PSORIASIS-UVEITI: 40 | 28 days supply | Qty: 4 | Fill #0

## 2016-05-31 ENCOUNTER — Ambulatory Visit (HOSPITAL_BASED_OUTPATIENT_CLINIC_OR_DEPARTMENT_OTHER): Payer: 59 | Admitting: Pharmacist

## 2016-05-31 DIAGNOSIS — L409 Psoriasis, unspecified: Secondary | ICD-10-CM

## 2016-05-31 MED ORDER — ADALIMUMAB 40 MG/0.8ML ~~LOC~~ PSKT: 40.0000 mg | PREFILLED_SYRINGE | SUBCUTANEOUS | Status: DC

## 2016-05-31 NOTE — Progress Notes (Signed)
S: Patient presents today to the [facility].  Patient is currently taking Humira for psoriasis. Patient is managed by Dr. Willette Pa for this. PCP is Dr. Virgina Jock.   Adherence: has not started yet.  Current adverse effects: n/a  Dosing:  Plaque psoriasis: SubQ: given starter pack for psoriasis Maintenance: 40 mg every other week beginning 1 week after initial dose  Drug-drug interactions: none  Screening: TB test: screened Hepatitis: screened   O:     Lab Results  Component Value Date   WBC 5.8 05/14/2014   HGB 12.9 05/14/2014   HCT 37.3 05/14/2014   MCV 85.9 05/14/2014   PLT 304 05/14/2014      Chemistry      Component Value Date/Time   NA 141 05/18/2015 0833   NA 139 05/14/2014 0944   K 3.8 05/18/2015 0833   CL 98 05/18/2015 0833   CO2 28 05/18/2015 0833   BUN 12 05/18/2015 0833   BUN 9 05/14/2014 0944   CREATININE 0.66 05/18/2015 0833   CREATININE 0.72 05/14/2014 0944      Component Value Date/Time   CALCIUM 9.7 05/18/2015 0833   ALKPHOS 75 05/18/2015 0833   AST 31 05/18/2015 0833   ALT 40* 05/18/2015 0833   BILITOT 0.6 05/18/2015 0833   BILITOT 0.4 07/21/2013 0735       A/P: 1. Medication review: Patient has yet to start Humira for psoriasis. Educated patient on the use of the Humira pen, and patient was able to demonstrate use. She will call with any questions regarding the medication when she self-administers at home this weekend (she does not want to administer in clinic). Counseled on adverse effects including injection site reactions, increased risk of infection, and possible increased risk of malignancy. CBC and other labs reviewed from PCP's office and all were WNL. Patient to continue to follow with Dr. Willette Pa.    Nicoletta Ba, PharmD, BCPS, [facility] [phone]  Evaluation and management procedures were performed by the Clinical  Pharmacy Practitioner under my supervision and collaboration. I have reviewed the CPP's note and chart, and I agree with the management and plan.   Angelica Chessman, MD, [place], [place], [place], [place] [facility] [place], [place]   05/31/2016, 4:27 PM

## 2016-06-21 DIAGNOSIS — Z6830 Body mass index (BMI) 30.0-30.9, adult: Secondary | ICD-10-CM | POA: Diagnosis not present

## 2016-06-21 DIAGNOSIS — R590 Localized enlarged lymph nodes: Secondary | ICD-10-CM | POA: Diagnosis not present

## 2016-06-26 MED FILL — LOSARTAN-HCTZ 100-25 MG TAB: 100-25 | 90 days supply | Qty: 90 | Fill #3

## 2016-07-19 MED FILL — HUMIRA 40 MG/0.8ML PSKT: 40 | 28 days supply | Qty: 2 | Fill #0

## 2016-07-21 ENCOUNTER — Telehealth: Payer: 59 | Admitting: Family

## 2016-07-21 DIAGNOSIS — J069 Acute upper respiratory infection, unspecified: Secondary | ICD-10-CM | POA: Diagnosis not present

## 2016-07-21 MED ORDER — BENZONATATE 100 MG PO CAPS: 100.0000 mg | ORAL_CAPSULE | Freq: Two times a day (BID) | ORAL | 0 refills | Status: DC | PRN

## 2016-07-21 MED ORDER — AZITHROMYCIN 250 MG PO TABS: ORAL_TABLET | ORAL | 0 refills | Status: DC

## 2016-07-21 NOTE — Progress Notes (Signed)

## 2016-07-22 DIAGNOSIS — E038 Other specified hypothyroidism: Secondary | ICD-10-CM | POA: Diagnosis not present

## 2016-07-22 DIAGNOSIS — M797 Fibromyalgia: Secondary | ICD-10-CM | POA: Diagnosis not present

## 2016-07-22 DIAGNOSIS — E784 Other hyperlipidemia: Secondary | ICD-10-CM | POA: Diagnosis not present

## 2016-07-30 DIAGNOSIS — L409 Psoriasis, unspecified: Secondary | ICD-10-CM | POA: Diagnosis not present

## 2016-07-30 DIAGNOSIS — R590 Localized enlarged lymph nodes: Secondary | ICD-10-CM | POA: Diagnosis not present

## 2016-07-30 DIAGNOSIS — I10 Essential (primary) hypertension: Secondary | ICD-10-CM | POA: Diagnosis not present

## 2016-07-30 DIAGNOSIS — Z566 Other physical and mental strain related to work: Secondary | ICD-10-CM | POA: Diagnosis not present

## 2016-07-30 DIAGNOSIS — R700 Elevated erythrocyte sedimentation rate: Secondary | ICD-10-CM | POA: Diagnosis not present

## 2016-07-30 DIAGNOSIS — E668 Other obesity: Secondary | ICD-10-CM | POA: Diagnosis not present

## 2016-07-30 DIAGNOSIS — E784 Other hyperlipidemia: Secondary | ICD-10-CM | POA: Diagnosis not present

## 2016-07-30 DIAGNOSIS — K5909 Other constipation: Secondary | ICD-10-CM | POA: Diagnosis not present

## 2016-07-30 DIAGNOSIS — M797 Fibromyalgia: Secondary | ICD-10-CM | POA: Diagnosis not present

## 2016-08-02 DIAGNOSIS — H5211 Myopia, right eye: Secondary | ICD-10-CM | POA: Diagnosis not present

## 2016-08-02 DIAGNOSIS — H53022 Refractive amblyopia, left eye: Secondary | ICD-10-CM | POA: Diagnosis not present

## 2016-08-02 DIAGNOSIS — H52201 Unspecified astigmatism, right eye: Secondary | ICD-10-CM | POA: Diagnosis not present

## 2016-08-02 DIAGNOSIS — H524 Presbyopia: Secondary | ICD-10-CM | POA: Diagnosis not present

## 2016-08-15 MED FILL — DULoxetine HCL 60 MG CPEP: 60 | 90 days supply | Qty: 90 | Fill #1

## 2016-08-15 MED FILL — HUMIRA 40 MG/0.8ML PSKT: 40 | 28 days supply | Qty: 2 | Fill #1

## 2016-08-16 ENCOUNTER — Other Ambulatory Visit: Payer: Self-pay | Admitting: Family Medicine

## 2016-08-16 MED FILL — OMEPRAZOLE DR 20 MG CAPSULE: 20 | 90 days supply | Qty: 90 | Fill #0

## 2016-08-30 MED FILL — LEVOTHYROXINE 88 MCG TABLET: 88 | 90 days supply | Qty: 90 | Fill #2

## 2016-08-30 MED FILL — ROSUVASTATIN CALCIUM 10 MG: 10 | 90 days supply | Qty: 90 | Fill #1

## 2016-09-10 DIAGNOSIS — L409 Psoriasis, unspecified: Secondary | ICD-10-CM | POA: Diagnosis not present

## 2016-10-08 MED FILL — LOSARTAN-HCTZ 100-25 MG TAB: 100-25 | 90 days supply | Qty: 90 | Fill #0

## 2016-10-24 MED FILL — TREMFYA 100 MG/ML SYRINGE: 100 | 28 days supply | Qty: 1 | Fill #0

## 2016-11-07 MED FILL — DULoxetine HCL 60 MG CPEP: 60 | 90 days supply | Qty: 90 | Fill #0

## 2016-11-08 MED FILL — OMEPRAZOLE DR 20 MG CAPSULE: 20 | 90 days supply | Qty: 90 | Fill #1

## 2016-11-12 DIAGNOSIS — L409 Psoriasis, unspecified: Secondary | ICD-10-CM | POA: Diagnosis not present

## 2016-12-04 ENCOUNTER — Other Ambulatory Visit (HOSPITAL_COMMUNITY): Payer: Self-pay | Admitting: Sports Medicine

## 2016-12-04 DIAGNOSIS — M545 Low back pain, unspecified: Secondary | ICD-10-CM

## 2016-12-04 DIAGNOSIS — G8929 Other chronic pain: Secondary | ICD-10-CM

## 2016-12-04 DIAGNOSIS — M25551 Pain in right hip: Secondary | ICD-10-CM | POA: Diagnosis not present

## 2016-12-06 ENCOUNTER — Ambulatory Visit (HOSPITAL_COMMUNITY): Admission: RE | Admit: 2016-12-06 | Payer: 59 | Source: Ambulatory Visit

## 2016-12-06 ENCOUNTER — Ambulatory Visit (HOSPITAL_COMMUNITY)
Admission: RE | Admit: 2016-12-06 | Discharge: 2016-12-06 | Disposition: A | Discharge status: Home / Self Care | Payer: 59 | Source: Ambulatory Visit | Attending: Sports Medicine | Admitting: Sports Medicine

## 2016-12-06 DIAGNOSIS — M8938 Hypertrophy of bone, other site: Secondary | ICD-10-CM | POA: Insufficient documentation

## 2016-12-06 DIAGNOSIS — M5126 Other intervertebral disc displacement, lumbar region: Secondary | ICD-10-CM | POA: Diagnosis not present

## 2016-12-06 DIAGNOSIS — G8929 Other chronic pain: Secondary | ICD-10-CM | POA: Insufficient documentation

## 2016-12-06 DIAGNOSIS — M47897 Other spondylosis, lumbosacral region: Secondary | ICD-10-CM | POA: Insufficient documentation

## 2016-12-06 DIAGNOSIS — M545 Low back pain, unspecified: Secondary | ICD-10-CM

## 2016-12-06 DIAGNOSIS — M4807 Spinal stenosis, lumbosacral region: Secondary | ICD-10-CM | POA: Insufficient documentation

## 2016-12-10 DIAGNOSIS — M545 Low back pain: Secondary | ICD-10-CM | POA: Diagnosis not present

## 2016-12-16 MED FILL — LEVOTHYROXINE 88 MCG TABLET: 88 | 90 days supply | Qty: 90 | Fill #3

## 2016-12-16 MED FILL — ROSUVASTATIN CALCIUM 10 MG: 10 | 90 days supply | Qty: 90 | Fill #2

## 2016-12-20 DIAGNOSIS — M5416 Radiculopathy, lumbar region: Secondary | ICD-10-CM | POA: Diagnosis not present

## 2016-12-20 DIAGNOSIS — M47817 Spondylosis without myelopathy or radiculopathy, lumbosacral region: Secondary | ICD-10-CM | POA: Diagnosis not present

## 2016-12-20 DIAGNOSIS — M545 Low back pain: Secondary | ICD-10-CM | POA: Diagnosis not present

## 2016-12-26 MED FILL — TREMFYA 100 MG/ML SYRINGE: 100 | 28 days supply | Qty: 1 | Fill #1

## 2017-01-11 DIAGNOSIS — Z79899 Other long term (current) drug therapy: Secondary | ICD-10-CM | POA: Diagnosis not present

## 2017-01-14 MED FILL — ACYCLOVIR 400 MG TABLET: 400 | 5 days supply | Qty: 15 | Fill #0

## 2017-01-22 DIAGNOSIS — M545 Low back pain: Secondary | ICD-10-CM | POA: Diagnosis not present

## 2017-01-22 MED FILL — LOSARTAN-HCTZ 100-25 MG TAB: 100-25 | 90 days supply | Qty: 90 | Fill #1

## 2017-01-22 MED FILL — MELOXICAM 15 MG TABLET: 15 | 30 days supply | Qty: 30 | Fill #0

## 2017-01-28 DIAGNOSIS — Z0000 Encounter for general adult medical examination without abnormal findings: Secondary | ICD-10-CM | POA: Diagnosis not present

## 2017-01-28 DIAGNOSIS — R8299 Other abnormal findings in urine: Secondary | ICD-10-CM | POA: Diagnosis not present

## 2017-01-28 DIAGNOSIS — E038 Other specified hypothyroidism: Secondary | ICD-10-CM | POA: Diagnosis not present

## 2017-01-31 DIAGNOSIS — M47817 Spondylosis without myelopathy or radiculopathy, lumbosacral region: Secondary | ICD-10-CM | POA: Diagnosis not present

## 2017-01-31 DIAGNOSIS — M545 Low back pain: Secondary | ICD-10-CM | POA: Diagnosis not present

## 2017-01-31 DIAGNOSIS — M5137 Other intervertebral disc degeneration, lumbosacral region: Secondary | ICD-10-CM | POA: Diagnosis not present

## 2017-02-04 ENCOUNTER — Encounter: Payer: Self-pay | Admitting: Internal Medicine

## 2017-02-04 DIAGNOSIS — I10 Essential (primary) hypertension: Secondary | ICD-10-CM | POA: Diagnosis not present

## 2017-02-04 DIAGNOSIS — K900 Celiac disease: Secondary | ICD-10-CM | POA: Diagnosis not present

## 2017-02-04 DIAGNOSIS — E784 Other hyperlipidemia: Secondary | ICD-10-CM | POA: Diagnosis not present

## 2017-02-04 DIAGNOSIS — K5909 Other constipation: Secondary | ICD-10-CM | POA: Diagnosis not present

## 2017-02-04 DIAGNOSIS — E038 Other specified hypothyroidism: Secondary | ICD-10-CM | POA: Diagnosis not present

## 2017-02-04 DIAGNOSIS — L408 Other psoriasis: Secondary | ICD-10-CM | POA: Diagnosis not present

## 2017-02-04 DIAGNOSIS — E559 Vitamin D deficiency, unspecified: Secondary | ICD-10-CM | POA: Diagnosis not present

## 2017-02-04 DIAGNOSIS — Z1389 Encounter for screening for other disorder: Secondary | ICD-10-CM | POA: Diagnosis not present

## 2017-02-04 DIAGNOSIS — Z23 Encounter for immunization: Secondary | ICD-10-CM | POA: Diagnosis not present

## 2017-02-04 DIAGNOSIS — E668 Other obesity: Secondary | ICD-10-CM | POA: Diagnosis not present

## 2017-02-04 DIAGNOSIS — Z0000 Encounter for general adult medical examination without abnormal findings: Secondary | ICD-10-CM | POA: Diagnosis not present

## 2017-02-04 MED FILL — METOPROLOL SUCC ER 25 MG TA: 25 | 30 days supply | Qty: 30 | Fill #0

## 2017-02-04 NOTE — Progress Notes (Signed)
error 

## 2017-02-18 ENCOUNTER — Other Ambulatory Visit: Payer: Self-pay | Admitting: Obstetrics and Gynecology

## 2017-02-18 DIAGNOSIS — Z124 Encounter for screening for malignant neoplasm of cervix: Secondary | ICD-10-CM | POA: Diagnosis not present

## 2017-02-18 DIAGNOSIS — M47816 Spondylosis without myelopathy or radiculopathy, lumbar region: Secondary | ICD-10-CM | POA: Insufficient documentation

## 2017-02-18 DIAGNOSIS — Z1231 Encounter for screening mammogram for malignant neoplasm of breast: Secondary | ICD-10-CM | POA: Diagnosis not present

## 2017-02-18 DIAGNOSIS — Z01419 Encounter for gynecological examination (general) (routine) without abnormal findings: Secondary | ICD-10-CM | POA: Diagnosis not present

## 2017-02-18 DIAGNOSIS — R32 Unspecified urinary incontinence: Secondary | ICD-10-CM | POA: Diagnosis not present

## 2017-02-19 LAB — CYTOLOGY - PAP

## 2017-02-21 DIAGNOSIS — I10 Essential (primary) hypertension: Secondary | ICD-10-CM | POA: Diagnosis not present

## 2017-02-21 DIAGNOSIS — M5136 Other intervertebral disc degeneration, lumbar region: Secondary | ICD-10-CM | POA: Diagnosis not present

## 2017-02-21 DIAGNOSIS — E784 Other hyperlipidemia: Secondary | ICD-10-CM | POA: Diagnosis not present

## 2017-02-21 MED FILL — METHOCARBAMOL 500 MG TABLET: 500 | 20 days supply | Qty: 60 | Fill #0

## 2017-03-05 MED FILL — DULoxetine HCL 60 MG CPEP: 60 | 90 days supply | Qty: 90 | Fill #1

## 2017-03-07 ENCOUNTER — Other Ambulatory Visit: Payer: Self-pay | Admitting: Pharmacist

## 2017-03-07 MED ORDER — GUSELKUMAB 100 MG/ML ~~LOC~~ SOSY: 1.0000 | PREFILLED_SYRINGE | SUBCUTANEOUS | 4 refills | Status: DC

## 2017-03-07 MED FILL — TREMFYA 100 MG/ML SYRINGE: 100 | 28 days supply | Qty: 1 | Fill #0

## 2017-03-07 NOTE — Telephone Encounter (Signed)
Patient being switched from Humira to [facility] for psoriasis. Will obtain most recent office notes from Dr. Willette Pa and follow up with patient. Carries similar risks to Humira and patient is aware of these risks.

## 2017-03-17 MED FILL — OXYBUTYNIN CL ER 10 MG TAB: 10 | 30 days supply | Qty: 30 | Fill #0

## 2017-03-31 MED FILL — ROSUVASTATIN CALCIUM 10 MG: 10 | 90 days supply | Qty: 90 | Fill #3

## 2017-03-31 MED FILL — LEVOTHYROXINE 88 MCG TABLET: 88 | 90 days supply | Qty: 90 | Fill #0

## 2017-04-01 DIAGNOSIS — M5106 Intervertebral disc disorders with myelopathy, lumbar region: Secondary | ICD-10-CM | POA: Diagnosis not present

## 2017-04-01 MED FILL — METOPROLOL SUCC ER 25 MG TA: 25 | 30 days supply | Qty: 30 | Fill #1

## 2017-04-01 MED FILL — CYCLOBENZAPRINE 10 MG TAB: 10 | 30 days supply | Qty: 30 | Fill #0

## 2017-04-02 ENCOUNTER — Ambulatory Visit: Payer: 59 | Admitting: Physical Therapy

## 2017-04-04 ENCOUNTER — Encounter: Payer: Self-pay | Admitting: Physical Therapy

## 2017-04-04 ENCOUNTER — Ambulatory Visit: Payer: 59 | Attending: Sports Medicine | Admitting: Physical Therapy

## 2017-04-04 DIAGNOSIS — M6281 Muscle weakness (generalized): Secondary | ICD-10-CM | POA: Diagnosis not present

## 2017-04-04 DIAGNOSIS — M5416 Radiculopathy, lumbar region: Secondary | ICD-10-CM | POA: Diagnosis not present

## 2017-04-04 DIAGNOSIS — M545 Low back pain, unspecified: Secondary | ICD-10-CM

## 2017-04-04 NOTE — Therapy (Signed)
[place], [place], [address] Phone: [phone]   Fax:  [phone]  Physical Therapy Evaluation  Patient Details  Name: Rebecca Carroll MRN: [id number] Date of Birth: [birth year] Referring Provider: Dr. Alfonso Ramus  Encounter Date: 04/04/2017      PT End of Session - 04/04/17 1341    Visit Number 1   Number of Visits 16   Date for PT Re-Evaluation 05/30/17   PT Start Time 0848   PT Stop Time 0945   PT Time Calculation (min) 57 min   Activity Tolerance Patient tolerated treatment well   Behavior During Therapy [facility] for tasks assessed/performed      Past Medical History:  Diagnosis Date  . Anxiety   . Celiac disease   . Essential hypertension, benign   . Fibromyalgia   . GERD (gastroesophageal reflux disease)   . High cholesterol   . History of anemia   . Hypothyroidism   . Migraine   . Scalp psoriasis     Past Surgical History:  Procedure Laterality Date  . ENDOMETRIAL ABLATION    . ESOPHAGOGASTRODUODENOSCOPY    . TONSILLECTOMY      There were no vitals filed for this visit.       Subjective Assessment - 04/04/17 0855    Subjective Pt has chronic back pain which has worsened since the past year.  Seems to be getting worse.  She has had injections (epidural) which help for a short period.  Will have another type of injection 04/11/17.  She has pain in her Rt. side of low back/hip and has centralized since the first injection.  Her most difficult task is getting up first thing in the AM. She has stiffness when she tried to stand up from bed.  Can't sleep on her Rt. side.     Pertinent History fibromyalgia, HTN, psoriasis   Limitations Standing;Walking;House hold activities;Other (comment);Lifting;Sitting  sleep    How long can you sit comfortably? not ever really, stands at her desk when she can , makes her hip hurt more    How long can you stand comfortably? sometimes has to stand most of the day  for training, 6 hours but not necessarilty comfortable , 6/10    How long can you walk comfortably? can get up to 6/10   Diagnostic tests MRI  Lumbar spondylosis primarily at the L5-S1 level. At L5-S1 there is a moderate disc bulge eccentric to the left   Patient Stated Goals get out of bed normally    Currently in Pain? Yes   Pain Score 6   10/10 in AM sometimes    Pain Location Back   Pain Orientation Right   Pain Descriptors / Indicators Tightness;Aching  crunching    Pain Type Chronic pain   Pain Radiating Towards Rt. hip    Pain Onset More than a month ago   Pain Frequency Constant   Aggravating Factors  sitting too long , stand too long    Pain Relieving Factors heat, changing positions   Effect of Pain on Daily Activities very simple things are painful , avoid heavy housework             [facility] PT Assessment - 04/04/17 0905      Assessment   Medical Diagnosis L5-S1 disc bulge   Referring Provider Dr. 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 , reading      Cognition   Overall Cognitive Status Within Functional Limits for tasks assessed     Sensation   Light Touch Appears Intact     Posture/Postural Control   Posture/Postural Control Postural limitations   Postural Limitations Rounded Shoulders;Forward head     AROM    Lumbar Flexion pain, 50% or more limited    Lumbar Extension 25% or more , painful    Lumbar - Right Side Bend pain on R    Lumbar - Left Side Bend Pain on R    Lumbar - Right Rotation Min pain, 25%   Lumbar - Left Rotation min pain 25%      Strength   Right Hip Flexion 3+/5   Right Hip Extension 3-/5   Left Hip Flexion 4+/5   Left Hip Extension 3+/5   Right Knee Flexion 3+/5   Right Knee Extension 4+/5   Left Knee Flexion 5/5   Left Knee Extension 5/5   Right Ankle Dorsiflexion 5/5   Left Ankle Dorsiflexion 4+/5     Flexibility   Hamstrings tight , 50-60 deg      Palpation   Spinal mobility painful, spasm   Palpation comment painful to palpation througout thoracolumbar paraspinal, more severe at L4L5S1 and TTP in piriformis glutes on Rt. side      Slump test   Findings Positive   Side Right     Prone Knee Bend Test   Findings Positive   Side Right     Straight Leg Raise   Findings Positive   Side  Right     other   Findings Positive   Side  Right   Comments traction relieved pain (manual and in sidelying, weight if Rt. LE)      Transfers   Comments Painful, increased time to stand fully                    [facility] Adult PT Treatment/Exercise - 04/04/17 0905      Traction   Type of Traction Lumbar   Min (lbs) 60   Max (lbs) 75   Hold Time 60   Rest Time 15   Time 15                PT Education - 04/04/17 1338    Education provided Yes   Education Details PT/POC, anatomy, traction , posture , HEP    Person(s) Educated Patient   Methods Explanation;Verbal cues;Handout   Comprehension Verbalized understanding;Returned demonstration;Verbal cues required;Need further instruction          PT Short Term Goals - 04/04/17 1348      PT SHORT TERM GOAL #1   Title Pt will be I with HEP for flexibility and initial core  stabilization.    Time 4   Period Weeks   Status New           PT Long Term Goals - 04/04/17 1350      PT LONG  TERM GOAL #1   Title FOTO score will improve to less than 30% impaired to demo functional mobility gains    Time 8   Period Weeks   Status New     PT LONG TERM GOAL #2   Title Pt will be able to transition to community fitness/wellness program without lasting pain increase.    Time 8   Period Weeks   Status New     PT LONG TERM GOAL #3   Title Pt will demo 5/5 strength bilateral in all planes for efficient movement patterns    Baseline weak hip ext, hip flexion and knee flexion, ankle DF (3/5 to 3+/5)    Time 8   Period Weeks     PT LONG TERM GOAL #4   Title Pt will be able to stand for work with pain less than 3/10 most of the time (up to 3 hours)    Baseline 6/10    Time 8   Period Weeks   Status New     PT LONG TERM GOAL #5   Title Pt will be I with more advanced HEP and show good body mechanics for lifting   Time 8   Period Weeks   Status New               Plan - 04/04/17 1342    Clinical Impression Statement Patient presents with low complexity eval of low back pain which has been ongoing and worsening in intensity, interfering with mobility for >6 mos.  The recent spinal injections have reduced the radicular pain in her Rt. leg.  She continues to suffer in AM and with transitional movements, prolonged positoning during the daytime hours.  She would like to avoid surgery and is hopeful PT can help her manage her pain.    Rehab Potential Excellent   PT Frequency 2x / week   PT Duration 8 weeks   PT Treatment/Interventions ADLs/Self Care Home Management;Moist Heat;Traction;Therapeutic activities;Dry needling;Therapeutic exercise;Ultrasound;Manual techniques;Taping;Passive range of motion;Patient/family education;Neuromuscular re-education;Electrical Stimulation;Cryotherapy   PT Next Visit Plan how was traction, repeat? Review, begin lumbar stab , manual    PT Home Exercise Plan prone , Tr A isometric, sidelying QL stretch and cat/camel    Consulted and Agree with  Plan of Care Patient      Patient will benefit from skilled therapeutic intervention in order to improve the following deficits and impairments:  Decreased range of motion, Difficulty walking, Increased fascial restricitons, Pain, Improper body mechanics, Impaired flexibility, Hypomobility, Increased muscle spasms, Decreased mobility, Decreased strength, Decreased activity tolerance  Visit Diagnosis: Radiculopathy, lumbar region  Muscle weakness (generalized)  Right-sided low back pain without sciatica, unspecified chronicity     Problem List Patient Active Problem List   Diagnosis Date Noted  . Vitamin D deficiency 06/09/2015  . Obesity 06/09/2015  . Elevated transaminase level 06/09/2015  . Heart palpitations 09/07/2013  . DOE (dyspnea on exertion) 09/01/2013  . 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 Allergic rhinitis 04/07/2013  . Allergic conjunctivitis 04/07/2013  . Unspecified visual disturbance 03/26/2013  . Celiac disease 02/17/2012  . GERD (gastroesophageal reflux disease) 12/26/2011  . Fibromyalgia 12/26/2011  . Essential hypertension, benign 12/26/2011  . Hyperlipidemia 12/26/2011  . Hypothyroidism 12/26/2011    PAA,JENNIFER 04/04/2017, 1:57 PM  [place], [place], [address] Phone: [phone]   Fax:  [phone]  Raeford Razor, PT 04/04/17 1:58 PM Phone: [phone] Fax: [phone]

## 2017-04-07 ENCOUNTER — Ambulatory Visit: Payer: 59 | Admitting: Physical Therapy

## 2017-04-08 ENCOUNTER — Ambulatory Visit: Payer: 59 | Admitting: Physical Therapy

## 2017-04-09 ENCOUNTER — Ambulatory Visit: Payer: 59 | Admitting: Physical Therapy

## 2017-04-09 ENCOUNTER — Encounter: Payer: Self-pay | Admitting: Physical Therapy

## 2017-04-09 DIAGNOSIS — M6281 Muscle weakness (generalized): Secondary | ICD-10-CM | POA: Diagnosis not present

## 2017-04-09 DIAGNOSIS — M5416 Radiculopathy, lumbar region: Secondary | ICD-10-CM

## 2017-04-09 DIAGNOSIS — M545 Low back pain, unspecified: Secondary | ICD-10-CM

## 2017-04-09 NOTE — Patient Instructions (Signed)
From exercise drawer: QL info McKenzie lateral techniques 3 X 10-30 seconds to decrease lateral shifting All 3 issued

## 2017-04-09 NOTE — Therapy (Signed)
[place], [place], [address] Phone: [phone]   Fax:  [phone]  Physical Therapy Treatment  Patient Details  Name: Rebecca Carroll MRN: [id number] Date of Birth: January 22, 1971 Referring Provider: Dr. Alfonso Ramus  Encounter Date: 04/09/2017      PT End of Session - 04/09/17 1442    Visit Number 2   Number of Visits 16   Date for PT Re-Evaluation 05/30/17   PT Start Time 1331   PT Stop Time 1430   PT Time Calculation (min) 59 min   Activity Tolerance Patient tolerated treatment well   Behavior During Therapy [facility] for tasks assessed/performed      Past Medical History:  Diagnosis Date  . Anxiety   . Celiac disease   . Essential hypertension, benign   . Fibromyalgia   . GERD (gastroesophageal reflux disease)   . High cholesterol   . History of anemia   . Hypothyroidism   . Migraine   . Scalp psoriasis     Past Surgical History:  Procedure Laterality Date  . ENDOMETRIAL ABLATION    . ESOPHAGOGASTRODUODENOSCOPY    . TONSILLECTOMY      There were no vitals filed for this visit.      Subjective Assessment - 04/09/17 1424    Subjective Traction made me stiff initially then I felt better for a couple of days.  I am still having pain getting in and out of bed.     Currently in Pain? Yes   Pain Score 6    Pain Location Back   Pain Orientation Right   Pain Descriptors / Indicators Tightness;Aching   Pain Type Chronic pain   Pain Radiating Towards Rt hip   Pain Frequency Constant   Aggravating Factors  sitting and standing too long,  flat in bed   Pain Relieving Factors heat,  changing positions,  traction.   Effect of Pain on Daily Activities ADL limited.  can't get things off the floor                         [facility] Adult PT Treatment/Exercise - 04/09/17 0001      Posture/Postural Control   Posture Comments She has been working on her posture at work     Scientist, product/process development --  QL info, and hip scoot to the side for sit to stand     Lumbar Exercises: Standing   Other Standing Lumbar Exercises QL stretch 1 rep stretching the right.   Other Standing Lumbar Exercises McKenzie lateral technique , Moving hipa to wall for decreased pain.  HEP  also in sidelying.  Mckenzie / QL stretch with arm overhead      Traction   Type of Traction --  with pelvic section adjusted for comfort.   Min (lbs) 60   Max (lbs) 75   Hold Time --  Protocol pre set for disc involvement with muscle guarding   Time 11     Manual Therapy   Manual Therapy Soft tissue mobilization   Manual therapy comments Rt gluteal, prone over 2 pillows   Soft tissue mobilization instrument assist,  pain decreased with manual                PT Education - 04/09/17 1441    Education provided Yes   Education Details HEP   Person(s) Educated Patient   Methods Explanation;Demonstration;Tactile cues;Verbal cues;Handout   Comprehension Verbalized understanding;Returned demonstration  PT Short Term Goals - 04/04/17 1348      PT SHORT TERM GOAL #1   Title Pt will be I with HEP for flexibility and initial core stabilization.    Time 4   Period Weeks   Status New           PT Long Term Goals - 04/04/17 1350      PT LONG TERM GOAL #1   Title FOTO score will improve to less than 30% impaired to demo functional mobility gains    Time 8   Period Weeks   Status New     PT LONG TERM GOAL #2   Title Pt will be able to transition to community fitness/wellness program without lasting pain increase.    Time 8   Period Weeks   Status New     PT LONG TERM GOAL #3   Title Pt will demo 5/5 strength bilateral in all planes for efficient movement patterns    Baseline weak hip ext, hip flexion and knee flexion, ankle DF (3/5 to 3+/5)    Time 8   Period Weeks     PT LONG TERM GOAL #4   Title Pt will be able to stand for work with pain less than 3/10 most of the time  (up to 3 hours)    Baseline 6/10    Time 8   Period Weeks   Status New     PT LONG TERM GOAL #5   Title Pt will be I with more advanced HEP and show good body mechanics for lifting   Time 8   Period Weeks   Status New               Plan - 04/09/17 1442    Clinical Impression Statement Traction helpful,  manual helpful,  exercise helpful.  Pain post traction 6/10.     PT Next Visit Plan how was traction, repeat? Review, begin lumbar stab , manual    PT Home Exercise Plan prone , Tr A isometric, sidelying QL stretch and cat/camel [place] lateral techniques   Consulted and Agree with Plan of Care Patient      Patient will benefit from skilled therapeutic intervention in order to improve the following deficits and impairments:  Decreased range of motion, Difficulty walking, Increased fascial restricitons, Pain, Improper body mechanics, Impaired flexibility, Hypomobility, Increased muscle spasms, Decreased mobility, Decreased strength, Decreased activity tolerance  Visit Diagnosis: Radiculopathy, lumbar region  Muscle weakness (generalized)  Right-sided low back pain without sciatica, unspecified chronicity     Problem List Patient Active Problem List   Diagnosis Date Noted  . Vitamin D deficiency 06/09/2015  . Obesity 06/09/2015  . Elevated transaminase level 06/09/2015  . Heart palpitations 09/07/2013  . DOE (dyspnea on exertion) 09/01/2013  . 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 Allergic rhinitis 04/07/2013  . Allergic conjunctivitis 04/07/2013  . Unspecified visual disturbance 03/26/2013  . Celiac disease 02/17/2012  . GERD (gastroesophageal reflux disease) 12/26/2011  . Fibromyalgia 12/26/2011  . Essential hypertension, benign 12/26/2011  . Hyperlipidemia 12/26/2011  . Hypothyroidism 12/26/2011    HARRIS,KAREN PTA 04/09/2017, 2:44 PM  [facility] [address] [place], [place], [address] Phone: [phone]    Fax:  [phone]  Name: Rebecca Carroll MRN: [id number] Date of Birth: 27-May-1971

## 2017-04-11 DIAGNOSIS — M5137 Other intervertebral disc degeneration, lumbosacral region: Secondary | ICD-10-CM | POA: Diagnosis not present

## 2017-04-11 DIAGNOSIS — M545 Low back pain: Secondary | ICD-10-CM | POA: Diagnosis not present

## 2017-04-11 DIAGNOSIS — M47817 Spondylosis without myelopathy or radiculopathy, lumbosacral region: Secondary | ICD-10-CM | POA: Diagnosis not present

## 2017-04-14 ENCOUNTER — Ambulatory Visit: Payer: 59 | Admitting: Physical Therapy

## 2017-04-16 ENCOUNTER — Ambulatory Visit: Payer: 59 | Admitting: Physical Therapy

## 2017-04-16 DIAGNOSIS — M6281 Muscle weakness (generalized): Secondary | ICD-10-CM

## 2017-04-16 DIAGNOSIS — M5416 Radiculopathy, lumbar region: Secondary | ICD-10-CM | POA: Diagnosis not present

## 2017-04-16 DIAGNOSIS — M545 Low back pain, unspecified: Secondary | ICD-10-CM

## 2017-04-16 NOTE — Therapy (Signed)
[place], [place], [address] Phone: [phone]   Fax:  [phone]  Physical Therapy Treatment  Patient Details  Name: Rebecca Carroll MRN: [id number] Date of Birth: 1971-12-07 Referring Provider: Dr. Alfonso Ramus  Encounter Date: 04/16/2017      PT End of Session - 04/16/17 1309    Visit Number 3   Number of Visits 16   Date for PT Re-Evaluation 05/30/17   PT Start Time 1300   PT Stop Time 1345   PT Time Calculation (min) 45 min   Activity Tolerance Patient tolerated treatment well   Behavior During Therapy [facility] for tasks assessed/performed      Past Medical History:  Diagnosis Date  . Anxiety   . Celiac disease   . Essential hypertension, benign   . Fibromyalgia   . GERD (gastroesophageal reflux disease)   . High cholesterol   . History of anemia   . Hypothyroidism   . Migraine   . Scalp psoriasis     Past Surgical History:  Procedure Laterality Date  . ENDOMETRIAL ABLATION    . ESOPHAGOGASTRODUODENOSCOPY    . TONSILLECTOMY      There were no vitals filed for this visit.      Subjective Assessment - 04/16/17 1300    Subjective Pt had an injection (2 ) in low lumbar spine, unsure of what it was but thinks it is meant to improve the disc space. I don't feel as cripped in the AM.    Currently in Pain? Yes   Pain Location Back  and Rt hip   Pain Orientation Right;Lateral   Pain Descriptors / Indicators Constant;Dull   Pain Type Chronic pain   Pain Onset More than a month ago   Pain Frequency Constant   Aggravating Factors  sit and stand too long   Pain Relieving Factors heat, injection and position changes, traction helped a bit             [facility] Adult PT Treatment/Exercise - 04/16/17 1312      Lumbar Exercises: Standing   Row Strengthening;Both;10 reps;Theraband   Theraband Level (Row) Level 3 (Green)   Shoulder Extension Strengthening;Both;15 reps;Theraband   Theraband Level  (Shoulder Extension) Level 3 (Green)     Lumbar Exercises: Supine   Ab Set 10 reps   Clam 10 reps   Clam Limitations 3 sets , bi and unilateral    Heel Slides 10 reps   Bent Knee Raise 10 reps   Dead Bug --   Bridge 10 reps     Lumbar Exercises: Sidelying   Clam 10 reps   Other Sidelying Lumbar Exercises supine clam with green band x 20                   PT Short Term Goals - 04/16/17 1316      PT SHORT TERM GOAL #1   Title Pt will be I with HEP for flexibility and initial core stabilization.    Status On-going           PT Long Term Goals - 04/16/17 1317      PT LONG TERM GOAL #1   Title FOTO score will improve to less than 30% impaired to demo functional mobility gains    Status On-going     PT LONG TERM GOAL #2   Title Pt will be able to transition to community fitness/wellness program without lasting pain increase.    Status On-going  PT LONG TERM GOAL #3   Title Pt will demo 5/5 strength bilateral in all planes for efficient movement patterns    Status On-going     PT LONG TERM GOAL #4   Title Pt will be able to stand for work with pain less than 3/10 most of the time (up to 3 hours)    Status On-going     PT LONG TERM GOAL #5   Title Pt will be I with more advanced HEP and show good body mechanics for lifting   Status On-going               Plan - 04/16/17 1343    Clinical Impression Statement Pt feeling more relief today likely due to injection.  She was able to do stabilization exercises without increasing her back pain.  Goals in progress.    PT Next Visit Plan Review lumbar stab , try manual ? Wait on traction due to recent injection.    PT Home Exercise Plan prone , Tr A isometric, sidelying QL stretch and cat/camel [place] lateral techniques and L stab 1   Consulted and Agree with Plan of Care Patient      Patient will benefit from skilled therapeutic intervention in order to improve the following deficits and impairments:   Decreased range of motion, Difficulty walking, Increased fascial restricitons, Pain, Improper body mechanics, Impaired flexibility, Hypomobility, Increased muscle spasms, Decreased mobility, Decreased strength, Decreased activity tolerance  Visit Diagnosis: Radiculopathy, lumbar region  Muscle weakness (generalized)  Right-sided low back pain without sciatica, unspecified chronicity     Problem List Patient Active Problem List   Diagnosis Date Noted  . Vitamin D deficiency 06/09/2015  . Obesity 06/09/2015  . Elevated transaminase level 06/09/2015  . Heart palpitations 09/07/2013  . DOE (dyspnea on exertion) 09/01/2013  . 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 Allergic rhinitis 04/07/2013  . Allergic conjunctivitis 04/07/2013  . Unspecified visual disturbance 03/26/2013  . Celiac disease 02/17/2012  . GERD (gastroesophageal reflux disease) 12/26/2011  . Fibromyalgia 12/26/2011  . Essential hypertension, benign 12/26/2011  . Hyperlipidemia 12/26/2011  . Hypothyroidism 12/26/2011    PAA,JENNIFER 04/16/2017, 1:44 PM  [facility] [address] [place], [place], [address] Phone: [phone]   Fax:  [phone]  Name: MARYAH MARINARO MRN: [id number] Date of Birth: 07-09-1971   Raeford Razor, PT 04/16/17 1:45 PM Phone: [phone] Fax: [phone]

## 2017-04-18 ENCOUNTER — Ambulatory Visit: Payer: 59 | Admitting: Physical Therapy

## 2017-04-18 DIAGNOSIS — M6281 Muscle weakness (generalized): Secondary | ICD-10-CM | POA: Diagnosis not present

## 2017-04-18 DIAGNOSIS — M545 Low back pain, unspecified: Secondary | ICD-10-CM

## 2017-04-18 DIAGNOSIS — M5416 Radiculopathy, lumbar region: Secondary | ICD-10-CM

## 2017-04-18 NOTE — Therapy (Signed)
[place], [place], [address] Phone: [phone]   Fax:  [phone]  Physical Therapy Treatment  Patient Details  Name: Rebecca Carroll MRN: [id number] Date of Birth: March 22, 1971 Referring Provider: Dr. Alfonso Ramus  Encounter Date: 04/18/2017      PT End of Session - 04/18/17 0806    Visit Number 4   Number of Visits 16   Date for PT Re-Evaluation 05/30/17   PT Start Time 0801   PT Stop Time 8299   PT Time Calculation (min) 57 min      Past Medical History:  Diagnosis Date  . Anxiety   . Celiac disease   . Essential hypertension, benign   . Fibromyalgia   . GERD (gastroesophageal reflux disease)   . High cholesterol   . History of anemia   . Hypothyroidism   . Migraine   . Scalp psoriasis     Past Surgical History:  Procedure Laterality Date  . ENDOMETRIAL ABLATION    . ESOPHAGOGASTRODUODENOSCOPY    . TONSILLECTOMY      There were no vitals filed for this visit.      Subjective Assessment - 04/18/17 0805    Subjective Was feeling better until this morning. Still better than before the injections.    Currently in Pain? Yes   Pain Score 4    Pain Location Back   Pain Orientation Right;Lateral   Pain Descriptors / Indicators --  stiff   Pain Type Chronic pain                         [facility] Adult PT Treatment/Exercise - 04/18/17 0001      Lumbar Exercises: Standing   Row 15 reps   Theraband Level (Row) Level 3 (Green)   Shoulder Extension Strengthening;15 reps   Theraband Level (Shoulder Extension) Level 3 (Green)   Other Standing Lumbar Exercises QL stretch 1 rep stretching the right.  60 seconds      Lumbar Exercises: Supine   Ab Set 10 reps   Clam 10 reps   Clam Limitations 3 sets , bi and unilateral   green band   Bent Knee Raise 10 reps     Lumbar Exercises: Sidelying   Clam 10 reps   Other Sidelying Lumbar Exercises supine clam with green band x 20      Modalities   Modalities Moist Heat     Moist Heat Therapy   Number Minutes Moist Heat 10 Minutes   Moist Heat Location Lumbar Spine     Manual Therapy   Manual Therapy Soft tissue mobilization   Manual therapy comments Right QL during stretch, massage roller to RT upper glutes                   PT Short Term Goals - 04/16/17 1316      PT SHORT TERM GOAL #1   Title Pt will be I with HEP for flexibility and initial core stabilization.    Status On-going           PT Long Term Goals - 04/16/17 1317      PT LONG TERM GOAL #1   Title FOTO score will improve to less than 30% impaired to demo functional mobility gains    Status On-going     PT LONG TERM GOAL #2   Title Pt will be able to transition to community fitness/wellness program without lasting pain increase.  Status On-going     PT LONG TERM GOAL #3   Title Pt will demo 5/5 strength bilateral in all planes for efficient movement patterns    Status On-going     PT LONG TERM GOAL #4   Title Pt will be able to stand for work with pain less than 3/10 most of the time (up to 3 hours)    Status On-going     PT LONG TERM GOAL #5   Title Pt will be I with more advanced HEP and show good body mechanics for lifting   Status On-going               Plan - 04/18/17 0849    Clinical Impression Statement Pt reports more stiffness this morning. We continued lumbar stabilization and hip strengthening. Massage roller used to decrease tenderness in right upper gluteals. Pt reports it is uncomfortable to lay on her back due to tenderness.    PT Next Visit Plan Review lumbar stab , try manual ? Wait on traction due to recent injection.    PT Home Exercise Plan prone , Tr A isometric, sidelying QL stretch and cat/camel Mc [place] lateral techniques and L stab 1   Consulted and Agree with Plan of Care Patient      Patient will benefit from skilled therapeutic intervention in order to improve the following deficits  and impairments:  Decreased range of motion, Difficulty walking, Increased fascial restricitons, Pain, Improper body mechanics, Impaired flexibility, Hypomobility, Increased muscle spasms, Decreased mobility, Decreased strength, Decreased activity tolerance  Visit Diagnosis: Radiculopathy, lumbar region  Muscle weakness (generalized)  Right-sided low back pain without sciatica, unspecified chronicity     Problem List Patient Active Problem List   Diagnosis Date Noted  . Vitamin D deficiency 06/09/2015  . Obesity 06/09/2015  . Elevated transaminase level 06/09/2015  . Heart palpitations 09/07/2013  . DOE (dyspnea on exertion) 09/01/2013  . 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 Allergic rhinitis 04/07/2013  . Allergic conjunctivitis 04/07/2013  . Unspecified visual disturbance 03/26/2013  . Celiac disease 02/17/2012  . GERD (gastroesophageal reflux disease) 12/26/2011  . Fibromyalgia 12/26/2011  . Essential hypertension, benign 12/26/2011  . Hyperlipidemia 12/26/2011  . Hypothyroidism 12/26/2011    Dorene Ar, PTA 04/18/2017, 8:50 AM  [facility] [address] [place], [place], [address] Phone: [phone]   Fax:  [phone]  Name: Rebecca Carroll MRN: [id number] Date of Birth: [birth year]

## 2017-04-21 ENCOUNTER — Ambulatory Visit: Payer: 59 | Admitting: Physical Therapy

## 2017-04-21 DIAGNOSIS — M5416 Radiculopathy, lumbar region: Secondary | ICD-10-CM

## 2017-04-21 DIAGNOSIS — M545 Low back pain, unspecified: Secondary | ICD-10-CM

## 2017-04-21 DIAGNOSIS — M6281 Muscle weakness (generalized): Secondary | ICD-10-CM | POA: Diagnosis not present

## 2017-04-21 NOTE — Therapy (Signed)
[place], [place], [address] Phone: [phone]   Fax:  [phone]  Physical Therapy Treatment  Patient Details  Name: Rebecca Carroll MRN: [id number] Date of Birth: 08-21-1971 Referring Provider: Dr. Alfonso Ramus  Encounter Date: 04/21/2017      PT End of Session - 04/21/17 1124    Visit Number 5   Number of Visits 16   Date for PT Re-Evaluation 05/30/17   PT Start Time 1105   PT Stop Time 1200   PT Time Calculation (min) 55 min   Behavior During Therapy [facility] for tasks assessed/performed      Past Medical History:  Diagnosis Date  . Anxiety   . Celiac disease   . Essential hypertension, benign   . Fibromyalgia   . GERD (gastroesophageal reflux disease)   . High cholesterol   . History of anemia   . Hypothyroidism   . Migraine   . Scalp psoriasis     Past Surgical History:  Procedure Laterality Date  . ENDOMETRIAL ABLATION    . ESOPHAGOGASTRODUODENOSCOPY    . TONSILLECTOMY      There were no vitals filed for this visit.      Subjective Assessment - 04/21/17 1109    Subjective Pt did some reasearch, had Celestone with Lidocaine injection L 5-S1. Right now feels pretty good, difficulty to get up in the AM   Currently in Pain? No/denies            [facility] PT Assessment - 04/21/17 0001      Strength   Right Hip ABduction 4/5   Left Hip ABduction 4+/5                     [facility] Adult PT Treatment/Exercise - 04/21/17 0001      Lumbar Exercises: Sidelying   Clam 20 reps   Hip Abduction 15 reps   Other Sidelying Lumbar Exercises Rt. QL prolonged stretch to demo home program      Lumbar Exercises: Prone   Other Prone Lumbar Exercises prone stabilization, pelvic press series including isometric, knee flexion and hip extension      Modalities   Modalities Moist Heat     Moist Heat Therapy   Number Minutes Moist Heat 10 Minutes   Moist Heat Location Lumbar Spine     Manual Therapy   Manual therapy comments Right QL during stretch   Soft tissue mobilization Rt. gluteals superior in sidelying, Rt. lumbar and QL                PT Education - 04/21/17 1124    Education provided Yes   Education Details stabilization, core contraction and coordinating breath, benefits and resources for therapeutic massage   Person(s) Educated Patient   Methods Explanation;Handout   Comprehension Verbalized understanding          PT Short Term Goals - 04/21/17 1126      PT SHORT TERM GOAL #1   Title Pt will be I with HEP for flexibility and initial core stabilization.    Status Achieved           PT Long Term Goals - 04/21/17 1126      PT LONG TERM GOAL #1   Title FOTO score will improve to less than 30% impaired to demo functional mobility gains    Status On-going     PT LONG TERM GOAL #2   Title Pt will be able to transition to  community fitness/wellness program without lasting pain increase.    Status On-going     PT LONG TERM GOAL #3   Title Pt will demo 5/5 strength bilateral in all planes for efficient movement patterns    Baseline weak hip ext, hip flexion and knee flexion, ankle DF (3/5 to 3+/5)      PT LONG TERM GOAL #4   Title Pt will be able to stand for work with pain less than 3/10 most of the time (up to 3 hours)    Baseline improved but can sometimes be up to 5/10    Status On-going     PT LONG TERM GOAL #5   Title Pt will be I with more advanced HEP and show good body mechanics for lifting   Status On-going               Plan - 04/21/17 1248    Clinical Impression Statement Patient is improving overall, but continues to have pain in AM.  Had some mild pain in L lumbar when lifting L. hip into extension.  Plans to make an appt for massage.  Can stand at work with less pain.    PT Next Visit Plan Cont stabilization , manual , add in quadruped?    PT Home Exercise Plan prone , Tr A isometric, sidelying QL stretch and  cat/camel Mc [place] lateral techniques and L stab 1   Consulted and Agree with Plan of Care Patient      Patient will benefit from skilled therapeutic intervention in order to improve the following deficits and impairments:  Decreased range of motion, Difficulty walking, Increased fascial restricitons, Pain, Improper body mechanics, Impaired flexibility, Hypomobility, Increased muscle spasms, Decreased mobility, Decreased strength, Decreased activity tolerance  Visit Diagnosis: Radiculopathy, lumbar region  Muscle weakness (generalized)  Right-sided low back pain without sciatica, unspecified chronicity     Problem List Patient Active Problem List   Diagnosis Date Noted  . Vitamin D deficiency 06/09/2015  . Obesity 06/09/2015  . Elevated transaminase level 06/09/2015  . Heart palpitations 09/07/2013  . DOE (dyspnea on exertion) 09/01/2013  . 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 Allergic rhinitis 04/07/2013  . Allergic conjunctivitis 04/07/2013  . Unspecified visual disturbance 03/26/2013  . Celiac disease 02/17/2012  . GERD (gastroesophageal reflux disease) 12/26/2011  . Fibromyalgia 12/26/2011  . Essential hypertension, benign 12/26/2011  . Hyperlipidemia 12/26/2011  . Hypothyroidism 12/26/2011    Jinny Sweetland 04/21/2017, 12:51 PM  [facility] [address] [place], [place], [address] Phone: [phone]   Fax:  [phone]  Name: ANGELIAH WISDOM MRN: [id number] Date of Birth: Jul 20, 1971  Raeford Razor, PT 04/21/17 12:52 PM Phone: [phone] Fax: [phone]

## 2017-04-23 ENCOUNTER — Ambulatory Visit: Payer: 59 | Admitting: Physical Therapy

## 2017-04-28 ENCOUNTER — Ambulatory Visit: Payer: 59 | Admitting: Physical Therapy

## 2017-04-28 DIAGNOSIS — M5416 Radiculopathy, lumbar region: Secondary | ICD-10-CM

## 2017-04-28 DIAGNOSIS — M6281 Muscle weakness (generalized): Secondary | ICD-10-CM

## 2017-04-28 DIAGNOSIS — M545 Low back pain, unspecified: Secondary | ICD-10-CM

## 2017-04-28 NOTE — Patient Instructions (Signed)
Bird Dog Pose - Intermediate    Keep pelvis stable. From table pose, step one leg back to plank pose. Reach opposite arm forward, back foot off floor.  Hold for __5 sec __ breaths. Return arm and leg at same rate. Repeat ___10_ times, alternating sides.  Copyright  VHI. All rights reserved.

## 2017-04-28 NOTE — Therapy (Signed)
[place], [place], [address] Phone: [phone]   Fax:  [phone]  Physical Therapy Treatment  Patient Details  Name: Rebecca Carroll MRN: [id number] Date of Birth: 1971/01/01 Referring Provider: Dr. Alfonso Ramus  Encounter Date: 04/28/2017      PT End of Session - 04/28/17 1214    Visit Number 6   Number of Visits 16   Date for PT Re-Evaluation 05/30/17   PT Start Time 3968   PT Stop Time 1228   PT Time Calculation (min) 35 min   Activity Tolerance Patient tolerated treatment well   Behavior During Therapy [facility] for tasks assessed/performed      Past Medical History:  Diagnosis Date  . Anxiety   . Celiac disease   . Essential hypertension, benign   . Fibromyalgia   . GERD (gastroesophageal reflux disease)   . High cholesterol   . History of anemia   . Hypothyroidism   . Migraine   . Scalp psoriasis     Past Surgical History:  Procedure Laterality Date  . ENDOMETRIAL ABLATION    . ESOPHAGOGASTRODUODENOSCOPY    . TONSILLECTOMY      There were no vitals filed for this visit.      Subjective Assessment - 04/28/17 1158    Subjective No pain right now.  Just some soreness along Rt. side and hip.    Currently in Pain? No/denies            [facility] PT Assessment - 04/28/17 1221      Strength   Right Hip Flexion 3+/5   Right Hip ABduction 4/5   Left Hip ABduction 4+/5   Right Knee Flexion 5/5   Right Knee Extension 5/5   Left Knee Flexion 5/5   Left Knee Extension 5/5   Right Ankle Dorsiflexion 5/5   Left Ankle Dorsiflexion 5/5                     [facility] Adult PT Treatment/Exercise - 04/28/17 1206      Lumbar Exercises: Stretches   Piriformis Stretch 2 reps;30 seconds     Lumbar Exercises: Aerobic   Stationary Bike NuStep L 5 UE and LE for 6 min as strengthening      Lumbar Exercises: Supine   Clam 10 reps   Bridge 10 reps   Bridge Limitations added clam for 10       Lumbar Exercises: Sidelying   Clam 20 reps   Clam Limitations then 10 reps single leg   supine      Lumbar Exercises: Quadruped   Madcat/Old Horse 10 reps   Madcat/Old Horse Limitations and then childs pose    Straight Leg Raise 10 reps   Opposite Arm/Leg Raise Right arm/Left leg;Left arm/Right leg;10 reps   Plank rocking in neutral with Tr A x 10                  PT Short Term Goals - 04/21/17 1126      PT SHORT TERM GOAL #1   Title Pt will be I with HEP for flexibility and initial core stabilization.    Status Achieved           PT Long Term Goals - 04/28/17 1215      PT LONG TERM GOAL #1   Title FOTO score will improve to less than 30% impaired to demo functional mobility gains    Status Unable to assess  PT LONG TERM GOAL #2   Title Pt will be able to transition to community fitness/wellness program without lasting pain increase.    Status On-going     PT LONG TERM GOAL #3   Title Pt will demo 5/5 strength bilateral in all planes for efficient movement patterns    Baseline weaker in hip abd and ext (4/5)   Status Partially Met     PT LONG TERM GOAL #4   Title Pt will be able to stand for work with pain less than 3/10 most of the time (up to 3 hours)    Status Partially Met     PT LONG TERM GOAL #5   Title Pt will be I with more advanced HEP and show good body mechanics for lifting   Status On-going               Plan - 04/28/17 1214    Clinical Impression Statement Patient is doing well, having mostly only soreness with extended periods of standing.  She has symmetrical strength in knee ext/flex and DF.  Min weakness in hips, core.  Plans to get massage soon.  Has 2 more visits here and may be ready for DC at that point.    PT Next Visit Plan Review lifting, body mechanics, Cont stabilization , manual   PT Home Exercise Plan prone , Tr A isometric, sidelying QL stretch and cat/camel Mc [place] lateral techniques and L stab 1, gave hr a  blue band    Consulted and Agree with Plan of Care Patient      Patient will benefit from skilled therapeutic intervention in order to improve the following deficits and impairments:  Decreased range of motion, Difficulty walking, Increased fascial restricitons, Pain, Improper body mechanics, Impaired flexibility, Hypomobility, Increased muscle spasms, Decreased mobility, Decreased strength, Decreased activity tolerance  Visit Diagnosis: Radiculopathy, lumbar region  Muscle weakness (generalized)  Right-sided low back pain without sciatica, unspecified chronicity     Problem List Patient Active Problem List   Diagnosis Date Noted  . Vitamin D deficiency 06/09/2015  . Obesity 06/09/2015  . Elevated transaminase level 06/09/2015  . Heart palpitations 09/07/2013  . DOE (dyspnea on exertion) 09/01/2013  . 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 Allergic rhinitis 04/07/2013  . Allergic conjunctivitis 04/07/2013  . Unspecified visual disturbance 03/26/2013  . Celiac disease 02/17/2012  . GERD (gastroesophageal reflux disease) 12/26/2011  . Fibromyalgia 12/26/2011  . Essential hypertension, benign 12/26/2011  . Hyperlipidemia 12/26/2011  . Hypothyroidism 12/26/2011    Arlyss Weathersby 04/28/2017, 12:37 PM  [facility] [address] [place], [place], [address] Phone: [phone]   Fax:  [phone]  Name: Rebecca Carroll MRN: [id number] Date of Birth: [birth year]  Raeford Razor, PT 04/28/17 12:38 PM Phone: [phone] Fax: [phone]

## 2017-04-30 ENCOUNTER — Ambulatory Visit: Payer: 59 | Admitting: Physical Therapy

## 2017-05-05 MED FILL — METOPROLOL SUCC ER 25 MG TA: 25 | 30 days supply | Qty: 30 | Fill #0

## 2017-05-05 MED FILL — TREMFYA 100 MG/ML SYRINGE: 100 | 28 days supply | Qty: 1 | Fill #1

## 2017-05-05 MED FILL — LOSARTAN-HCTZ 100-25 MG TAB: 100-25 | 90 days supply | Qty: 90 | Fill #2

## 2017-05-06 ENCOUNTER — Ambulatory Visit: Payer: 59 | Attending: Sports Medicine | Admitting: Physical Therapy

## 2017-05-06 DIAGNOSIS — M5416 Radiculopathy, lumbar region: Secondary | ICD-10-CM | POA: Diagnosis not present

## 2017-05-06 DIAGNOSIS — M6281 Muscle weakness (generalized): Secondary | ICD-10-CM | POA: Insufficient documentation

## 2017-05-06 DIAGNOSIS — M545 Low back pain, unspecified: Secondary | ICD-10-CM

## 2017-05-06 NOTE — Therapy (Addendum)
[place], [place], [address] Phone: [phone]   Fax:  [phone]  Physical Therapy Treatment and Discharge Patient Details  Name: Rebecca Carroll MRN: [id number] Date of Birth: 11/11/1971 Referring Provider: Dr. Alfonso Ramus  Encounter Date: 05/06/2017      PT End of Session - 05/06/17 0905    Visit Number 7   Number of Visits 16   Date for PT Re-Evaluation 05/30/17   PT Start Time 0904   PT Stop Time 0936   PT Time Calculation (min) 32 min   Activity Tolerance Patient tolerated treatment well   Behavior During Therapy [facility] for tasks assessed/performed      Past Medical History:  Diagnosis Date  . Anxiety   . Celiac disease   . Essential hypertension, benign   . Fibromyalgia   . GERD (gastroesophageal reflux disease)   . High cholesterol   . History of anemia   . Hypothyroidism   . Migraine   . Scalp psoriasis     Past Surgical History:  Procedure Laterality Date  . ENDOMETRIAL ABLATION    . ESOPHAGOGASTRODUODENOSCOPY    . TONSILLECTOMY      There were no vitals filed for this visit.      Subjective Assessment - 05/06/17 0906    Subjective When I have pain now its in the AM.  Does her stretches and it goes away.    Currently in Pain? No/denies            [facility] PT Assessment - 05/06/17 0945      Observation/Other Assessments   Focus on Therapeutic Outcomes (FOTO)  4%            [facility] Adult PT Treatment/Exercise - 05/06/17 0915      Self-Care   Self-Care Lifting;Posture;Other Self-Care Comments   Lifting neutral spine, hip hinge    Posture changing positions, technique for reducing pain    Other Self-Care Comments  HEP and stab review     Lumbar Exercises: Stretches   Active Hamstring Stretch 3 reps;30 seconds   Lower Trunk Rotation 5 reps;10 seconds     Lumbar Exercises: Aerobic   Stationary Bike Recumbant bike L 2 for 5 min      Lumbar Exercises: Supine   Ab Set 10  reps   Clam 10 reps;20 reps   Clam Limitations done unilateral and bilateral    Bridge 10 reps   Bridge Limitations added clam for 10      Lumbar Exercises: Sidelying   Clam 10 reps   Hip Abduction 10 reps   Hip Abduction Weights (lbs) bent knee   Other Sidelying Lumbar Exercises side plank 10 sec x 5 each side   Other Sidelying Lumbar Exercises side QL stretch                 PT Education - 05/06/17 0911    Education provided Yes   Education Details full HEP, core stabilization   Person(s) Educated Patient   Methods Explanation;Handout   Comprehension Verbalized understanding;Returned demonstration          PT Short Term Goals - 04/21/17 1126      PT SHORT TERM GOAL #1   Title Pt will be I with HEP for flexibility and initial core stabilization.    Status Achieved           PT Long Term Goals - 05/06/17 0943      PT LONG TERM GOAL #1  Title FOTO score will improve to less than 30% impaired to demo functional mobility gains    Status Achieved     PT LONG TERM GOAL #2   Title Pt will be able to transition to community fitness/wellness program without lasting pain increase.    Baseline walking, HEP and may do Pilates Mat class   Status Partially Met     PT LONG TERM GOAL #3   Title Pt will demo 5/5 strength bilateral in all planes for efficient movement patterns    Baseline weaker in hip abd and ext (4/5)   Status Partially Met     PT LONG TERM GOAL #4   Title Pt will be able to stand for work with pain less than 3/10 most of the time (up to 3 hours)    Status Achieved     PT LONG TERM GOAL #5   Title Pt will be I with more advanced HEP and show good body mechanics for lifting   Status Achieved               Plan - 05/06/17 0912    Clinical Impression Statement Pt is in control of her pain, can manage her pain in the AM with simple stretches. She is improved to only 4 % limited.  She plans to make an appt in 2 weeks if needed and may  decide to cancel if she is still feeling great.  Also may be interested in Pilates Mat Class for low back pain.       Patient will benefit from skilled therapeutic intervention in order to improve the following deficits and impairments:  Decreased range of motion, Difficulty walking, Increased fascial restricitons, Pain, Improper body mechanics, Impaired flexibility, Hypomobility, Increased muscle spasms, Decreased mobility, Decreased strength, Decreased activity tolerance  Visit Diagnosis: Radiculopathy, lumbar region  Muscle weakness (generalized)  Right-sided low back pain without sciatica, unspecified chronicity     Problem List Patient Active Problem List   Diagnosis Date Noted  . Vitamin D deficiency 06/09/2015  . Obesity 06/09/2015  . Elevated transaminase level 06/09/2015  . Heart palpitations 09/07/2013  . DOE (dyspnea on exertion) 09/01/2013  . 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 Allergic rhinitis 04/07/2013  . Allergic conjunctivitis 04/07/2013  . Unspecified visual disturbance 03/26/2013  . Celiac disease 02/17/2012  . GERD (gastroesophageal reflux disease) 12/26/2011  . Fibromyalgia 12/26/2011  . Essential hypertension, benign 12/26/2011  . Hyperlipidemia 12/26/2011  . Hypothyroidism 12/26/2011    Ceejay Kegley 05/06/2017, 9:46 AM  [facility] [address] [place], [place], [address] Phone: [phone]   Fax:  [phone]  Name: Rebecca Carroll MRN: [id number] Date of Birth: [birth year]   Raeford Razor, PT 05/06/17 9:47 AM Phone: [phone] Fax: [phone]   PHYSICAL THERAPY DISCHARGE SUMMARY  Visits from Start of Care: 7  Current functional level related to goals / functional outcomes: See above for most recent.  Not limited by pain in low back.    Remaining deficits: None limiting function  Education / Equipment: HEP, stabilization , anatomy  Plan: Patient agrees to discharge.  Patient goals  were not met. Patient is being discharged due to meeting the stated rehab goals.  ?????     Raeford Razor, PT 06/10/17 2:42 PM Phone: [phone] Fax: [phone]

## 2017-05-20 ENCOUNTER — Ambulatory Visit: Payer: 59 | Admitting: Physical Therapy

## 2017-05-20 DIAGNOSIS — L290 Pruritus ani: Secondary | ICD-10-CM | POA: Diagnosis not present

## 2017-05-20 DIAGNOSIS — Z6831 Body mass index (BMI) 31.0-31.9, adult: Secondary | ICD-10-CM | POA: Diagnosis not present

## 2017-05-28 MED FILL — OXYBUTYNIN CL ER 10 MG TAB: 10 | 30 days supply | Qty: 30 | Fill #1

## 2017-06-10 MED FILL — DULoxetine HCL 60 MG CPEP: 60 | 90 days supply | Qty: 90 | Fill #2

## 2017-06-13 MED FILL — FLUCONAZOLE 150 MG TABLET: 150 | 1 days supply | Qty: 1 | Fill #0

## 2017-06-19 DIAGNOSIS — Z6831 Body mass index (BMI) 31.0-31.9, adult: Secondary | ICD-10-CM | POA: Diagnosis not present

## 2017-06-19 DIAGNOSIS — A630 Anogenital (venereal) warts: Secondary | ICD-10-CM | POA: Diagnosis not present

## 2017-06-19 DIAGNOSIS — L292 Pruritus vulvae: Secondary | ICD-10-CM | POA: Diagnosis not present

## 2017-07-01 DIAGNOSIS — K649 Unspecified hemorrhoids: Secondary | ICD-10-CM | POA: Diagnosis not present

## 2017-07-01 MED FILL — HYDROCORTISONE 2.5% CREAM: 2.5 | 14 days supply | Qty: 30 | Fill #0

## 2017-07-15 DIAGNOSIS — L281 Prurigo nodularis: Secondary | ICD-10-CM | POA: Diagnosis not present

## 2017-07-15 DIAGNOSIS — L409 Psoriasis, unspecified: Secondary | ICD-10-CM | POA: Diagnosis not present

## 2017-07-15 MED FILL — ROSUVASTATIN CALCIUM 10 MG: 10 | 90 days supply | Qty: 90 | Fill #0

## 2017-07-15 MED FILL — LEVOTHYROXINE 88 MCG TABLET: 88 | 90 days supply | Qty: 90 | Fill #1

## 2017-07-22 DIAGNOSIS — R87619 Unspecified abnormal cytological findings in specimens from cervix uteri: Secondary | ICD-10-CM | POA: Diagnosis not present

## 2017-07-22 MED FILL — IMIQUIMOD 5% CREAM PACKET: 5 | 56 days supply | Qty: 24 | Fill #0

## 2017-08-05 ENCOUNTER — Telehealth (HOSPITAL_BASED_OUTPATIENT_CLINIC_OR_DEPARTMENT_OTHER): Payer: 59 | Admitting: Pharmacist

## 2017-08-05 DIAGNOSIS — M5136 Other intervertebral disc degeneration, lumbar region: Secondary | ICD-10-CM | POA: Diagnosis not present

## 2017-08-05 DIAGNOSIS — E038 Other specified hypothyroidism: Secondary | ICD-10-CM | POA: Diagnosis not present

## 2017-08-05 DIAGNOSIS — E668 Other obesity: Secondary | ICD-10-CM | POA: Diagnosis not present

## 2017-08-05 DIAGNOSIS — K900 Celiac disease: Secondary | ICD-10-CM | POA: Diagnosis not present

## 2017-08-05 DIAGNOSIS — L290 Pruritus ani: Secondary | ICD-10-CM | POA: Diagnosis not present

## 2017-08-05 DIAGNOSIS — L409 Psoriasis, unspecified: Secondary | ICD-10-CM

## 2017-08-05 DIAGNOSIS — Z6828 Body mass index (BMI) 28.0-28.9, adult: Secondary | ICD-10-CM | POA: Diagnosis not present

## 2017-08-05 DIAGNOSIS — I10 Essential (primary) hypertension: Secondary | ICD-10-CM | POA: Diagnosis not present

## 2017-08-05 MED ORDER — APREMILAST 30 MG PO TABS: 1.0000 | ORAL_TABLET | Freq: Two times a day (BID) | ORAL | 6 refills | Status: DC

## 2017-08-05 MED FILL — OTEZLA 30 MG TABS: 30 | 30 days supply | Qty: 60 | Fill #0

## 2017-08-05 NOTE — Progress Notes (Signed)
   S: Patient presents to [facility] for review of their specialty medication therapy.  Patient is currently taking Otezla for psoriasis. Patient is managed by Robyne Askew for this. She has failed Humira and Tremfya  Adherence: only on day 3, no missed doses so far  Efficacy: just started, has not seen a difference yet.  Dosing:  Active psoriatic arthritis or plaque psoriasis (moderate to severe): Oral: Initial: 10 mg in the morning. Titrate upward by additional 10 mg per day on days 2 to 5 as follows: Day 2: 10 mg twice daily; Day 3: 10 mg in the morning and 20 mg in the evening; Day 4: 20 mg twice daily; Day 5: 20 mg in the morning and 30 mg in the evening. Maintenance dose: 30 mg twice daily starting on day 6  Current adverse effects: Headache: denies GI upset: reports nausea that started this morning. Weight loss: denies Neuropsychiatric effects: denies  O:     Lab Results  Component Value Date   WBC 5.8 05/14/2014   HGB 12.9 05/14/2014   HCT 37.3 05/14/2014   MCV 85.9 05/14/2014   PLT 304 05/14/2014      Chemistry      Component Value Date/Time   NA 141 05/18/2015 0833   K 3.8 05/18/2015 0833   CL 98 05/18/2015 0833   CO2 28 05/18/2015 0833   BUN 12 05/18/2015 0833   CREATININE 0.66 05/18/2015 0833   CREATININE 0.72 05/14/2014 0944      Component Value Date/Time   CALCIUM 9.7 05/18/2015 0833   ALKPHOS 75 05/18/2015 0833   AST 31 05/18/2015 0833   ALT 40 (H) 05/18/2015 0833   BILITOT 0.6 05/18/2015 0833       A/P: 1. Medication review: patient is currently on Otezla for psoriasis and is just starting it so cannot determine efficacy at this point.  Reviewed the medication including the following: apremilast inhibits phosphodiesterase 4 (PDE4) specific for cyclic adenosine monophosphate (cAMP) which results in increased intracellular cAMP levels and regulation of numerous inflammatory mediators (eg, decreased expression of nitric  oxide synthase, TNF-alpha, and interleukin [IL]-23, as well as increased IL-10. Possible adverse effects include weight loss, GI upset, headache, and mood changes. Renal function should be routinely monitored. No recommendations for any changes at this time.   Christella Hartigan, PharmD, BCPS, BCACP, [facility] [phone]

## 2017-08-08 MED FILL — OXYBUTYNIN CL ER 10 MG TAB: 10 | 30 days supply | Qty: 30 | Fill #2

## 2017-08-08 MED FILL — LOSARTAN-HCTZ 100-25 MG TAB: 100-25 | 90 days supply | Qty: 90 | Fill #3

## 2017-08-25 DIAGNOSIS — R110 Nausea: Secondary | ICD-10-CM | POA: Diagnosis not present

## 2017-08-25 DIAGNOSIS — F418 Other specified anxiety disorders: Secondary | ICD-10-CM | POA: Diagnosis not present

## 2017-08-25 DIAGNOSIS — Z6828 Body mass index (BMI) 28.0-28.9, adult: Secondary | ICD-10-CM | POA: Diagnosis not present

## 2017-09-09 DIAGNOSIS — L290 Pruritus ani: Secondary | ICD-10-CM | POA: Diagnosis not present

## 2017-09-19 MED FILL — DULoxetine HCL 60 MG CPEP: 60 | 90 days supply | Qty: 90 | Fill #0

## 2017-10-27 MED FILL — ALPRAZolam 0.5 MG TABS: 0.5 | 30 days supply | Qty: 30 | Fill #0

## 2017-10-27 MED FILL — LEVOTHYROXINE 88 MCG TABLET: 88 | 90 days supply | Qty: 90 | Fill #2

## 2017-10-27 MED FILL — ROSUVASTATIN CALCIUM 10 MG: 10 | 90 days supply | Qty: 90 | Fill #1

## 2017-11-17 ENCOUNTER — Telehealth: Payer: 59 | Admitting: Family

## 2017-11-17 DIAGNOSIS — B9689 Other specified bacterial agents as the cause of diseases classified elsewhere: Secondary | ICD-10-CM

## 2017-11-17 DIAGNOSIS — J028 Acute pharyngitis due to other specified organisms: Secondary | ICD-10-CM

## 2017-11-17 MED ORDER — AZITHROMYCIN 250 MG PO TABS: ORAL_TABLET | ORAL | 0 refills | Status: DC

## 2017-11-17 MED ORDER — PREDNISONE 5 MG PO TABS: 5.0000 mg | ORAL_TABLET | ORAL | 0 refills | Status: DC

## 2017-11-17 MED ORDER — BENZONATATE 100 MG PO CAPS: 100.0000 mg | ORAL_CAPSULE | Freq: Three times a day (TID) | ORAL | 0 refills | Status: DC | PRN

## 2017-11-17 MED FILL — BENZONATATE 100 MG CAPS: 100 | 5 days supply | Qty: 30 | Fill #0

## 2017-11-17 MED FILL — predniSONE 5 MG TABS: 5 | 6 days supply | Qty: 21 | Fill #0

## 2017-11-17 MED FILL — AZITHROMYCIN 250 MG TABLET: 250 | 5 days supply | Qty: 6 | Fill #0

## 2017-11-17 MED FILL — LOSARTAN-HCTZ 100-25 MG TAB: 100-25 | 90 days supply | Qty: 90 | Fill #0

## 2017-11-17 NOTE — Progress Notes (Signed)
Thank you for the details you included in the comment boxes. Those details are very helpful in determining the best course of treatment for you and help [place] to provide the best care. The treatment plan below covers cough/sinus infection.  We are sorry that you are not feeling well.  Here is how we plan to help!  Based on your presentation I believe you most likely have A cough due to bacteria.  When patients have a fever and a productive cough with a change in color or increased sputum production, we are concerned about bacterial bronchitis.  If left untreated it can progress to pneumonia.  If your symptoms do not improve with your treatment plan it is important that you contact your provider.   I have prescribed Azithromyin 250 mg: two tablets now and then one tablet daily for 4 additonal days    In addition you may use A non-prescription cough medication called Mucinex DM: take 2 tablets every 12 hours. and A prescription cough medication called Tessalon Perles 100mg . You may take 1-2 capsules every 8 hours as needed for your cough.  Sterapred 5 mg dosepak  From your responses in the eVisit questionnaire you describe inflammation in the upper respiratory tract which is causing a significant cough.  This is commonly called Bronchitis and has four common causes:    Allergies  Viral Infections  Acid Reflux  Bacterial Infection Allergies, viruses and acid reflux are treated by controlling symptoms or eliminating the cause. An example might be a cough caused by taking certain blood pressure medications. You stop the cough by changing the medication. Another example might be a cough caused by acid reflux. Controlling the reflux helps control the cough.  USE OF BRONCHODILATOR ("RESCUE") INHALERS: There is a risk from using your bronchodilator too frequently.  The risk is that over-reliance on a medication which only relaxes the muscles surrounding the breathing tubes can reduce the effectiveness of  medications prescribed to reduce swelling and congestion of the tubes themselves.  Although you feel brief relief from the bronchodilator inhaler, your asthma may actually be worsening with the tubes becoming more swollen and filled with mucus.  This can delay other crucial treatments, such as oral steroid medications. If you need to use a bronchodilator inhaler daily, several times per day, you should discuss this with your provider.  There are probably better treatments that could be used to keep your asthma under control.     HOME CARE . Only take medications as instructed by your medical team. . Complete the entire course of an antibiotic. . Drink plenty of fluids and get plenty of rest. . Avoid close contacts especially the very young and the elderly . Cover your mouth if you cough or cough into your sleeve. . Always remember to wash your hands . A steam or ultrasonic humidifier can help congestion.   GET HELP RIGHT AWAY IF: . You develop worsening fever. . You become short of breath . You cough up blood. . Your symptoms persist after you have completed your treatment plan MAKE SURE YOU   Understand these instructions.  Will watch your condition.  Will get help right away if you are not doing well or get worse.  Your e-visit answers were reviewed by a board certified advanced clinical practitioner to complete your personal care plan.  Depending on the condition, your plan could have included both over the counter or prescription medications. If there is a problem please reply  once you have  received a response from your provider. Your safety is important to [place].  If you have drug allergies check your prescription carefully.    You can use MyChart to ask questions about today's visit, request a non-urgent call back, or ask for a work or school excuse for 24 hours related to this e-Visit. If it has been greater than 24 hours you will need to follow up with your provider, or enter a new  e-Visit to address those concerns. You will get an e-mail in the next two days asking about your experience.  I hope that your e-visit has been valuable and will speed your recovery. Thank you for using e-visits.

## 2017-12-01 ENCOUNTER — Telehealth: Payer: Self-pay | Admitting: Internal Medicine

## 2017-12-01 NOTE — Telephone Encounter (Signed)
Vit D Def is common, but is not consider to be the cause of celiac disease. Celiac disease is a genetic predisposition to gluten allergy that then forms in response to gluten Can check her Vit D levels and we can let her know if she is low Would also check B12, folate and ferritin, repeat TTG IgA also

## 2017-12-01 NOTE — Telephone Encounter (Signed)
Pt states that she has talked to several people that have celiac disease also and they were told that their vitamin d level was low and took vitamin d and this helped their celiac. Pt wants to know if this is the case/true. Please advise.

## 2017-12-02 ENCOUNTER — Other Ambulatory Visit: Payer: Self-pay

## 2017-12-02 DIAGNOSIS — K900 Celiac disease: Secondary | ICD-10-CM

## 2017-12-02 NOTE — Telephone Encounter (Signed)
Spoke with pt and she is aware and will come for labs, orders in epic. 

## 2017-12-03 ENCOUNTER — Other Ambulatory Visit (INDEPENDENT_AMBULATORY_CARE_PROVIDER_SITE_OTHER): Payer: 59

## 2017-12-03 DIAGNOSIS — K900 Celiac disease: Secondary | ICD-10-CM

## 2017-12-03 LAB — FOLATE: Folate: 13.2 ng/mL (ref >5.9)

## 2017-12-03 LAB — FERRITIN: Ferritin: 17.6 ng/mL (ref 10.0–291.0)

## 2017-12-03 LAB — VITAMIN B12: Vitamin B-12: 708 pg/mL (ref 211–911)

## 2017-12-04 LAB — GLIADIN IGA+TTG IGA
Antigliadin Abs, IgA: 55 units — ABNORMAL HIGH (ref 0–19)
Transglutaminase IgA: <2 U/mL (ref 0–3)

## 2017-12-06 LAB — VITAMIN D 1,25 DIHYDROXY
Vitamin D 1, 25 (OH)2 Total: 53 pg/mL (ref 18–72)
Vitamin D2 1, 25 (OH)2: <8 pg/mL
Vitamin D3 1, 25 (OH)2: 53 pg/mL

## 2017-12-19 MED FILL — DULoxetine HCL 60 MG CPEP: 60 | 90 days supply | Qty: 90 | Fill #1

## 2018-01-07 DIAGNOSIS — Z6825 Body mass index (BMI) 25.0-25.9, adult: Secondary | ICD-10-CM | POA: Diagnosis not present

## 2018-01-07 DIAGNOSIS — R634 Abnormal weight loss: Secondary | ICD-10-CM | POA: Diagnosis not present

## 2018-01-07 DIAGNOSIS — E038 Other specified hypothyroidism: Secondary | ICD-10-CM | POA: Diagnosis not present

## 2018-01-07 DIAGNOSIS — I10 Essential (primary) hypertension: Secondary | ICD-10-CM | POA: Diagnosis not present

## 2018-01-07 DIAGNOSIS — K219 Gastro-esophageal reflux disease without esophagitis: Secondary | ICD-10-CM | POA: Diagnosis not present

## 2018-01-07 DIAGNOSIS — F418 Other specified anxiety disorders: Secondary | ICD-10-CM | POA: Diagnosis not present

## 2018-01-07 MED FILL — SERTRALINE HCL 25 MG TABLET: 25 | 30 days supply | Qty: 30 | Fill #0

## 2018-01-12 ENCOUNTER — Telehealth: Payer: 59 | Admitting: Family

## 2018-01-12 DIAGNOSIS — J329 Chronic sinusitis, unspecified: Secondary | ICD-10-CM | POA: Diagnosis not present

## 2018-01-12 DIAGNOSIS — B9789 Other viral agents as the cause of diseases classified elsewhere: Secondary | ICD-10-CM | POA: Diagnosis not present

## 2018-01-12 MED ORDER — BENZONATATE 100 MG PO CAPS: 100.0000 mg | ORAL_CAPSULE | Freq: Three times a day (TID) | ORAL | 0 refills | Status: DC | PRN

## 2018-01-12 MED ORDER — FLUTICASONE PROPIONATE 50 MCG/ACT NA SUSP
2.0000 | Freq: Every day | NASAL | 6 refills | Status: DC
Start: 2018-01-12 — End: 2019-09-07

## 2018-01-12 MED ORDER — PREDNISONE 5 MG PO TABS: 5.0000 mg | ORAL_TABLET | ORAL | 0 refills | Status: DC

## 2018-01-12 NOTE — Progress Notes (Signed)
Thank you for the details you included in the comment boxes. Those details are very helpful in determining the best course of treatment for you and help [place] to provide the best care. The prescriptions below with help with the runny/stuffy nose also.   We are sorry that you are not feeling well.  Here is how we plan to help!  Based on what you have shared with me it looks like you have sinusitis.  Sinusitis is inflammation and infection in the sinus cavities of the head.  Based on your presentation I believe you most likely have Acute Viral Sinusitis.This is an infection most likely caused by a virus. There is not specific treatment for viral sinusitis other than to help you with the symptoms until the infection runs its course.  You may use an oral decongestant such as Mucinex D or if you have glaucoma or high blood pressure use plain Mucinex. Saline nasal spray help and can safely be used as often as needed for congestion, I have prescribed: Fluticasone nasal spray two sprays in each nostril once a day In addition to Prednisone dose pack 5mg  to help with the swelling and cough. I also sent Tessalon Perles 100mg , take 1-2 capsules every 8 hours as needed for cough.   Some authorities believe that zinc sprays or the use of Echinacea may shorten the course of your symptoms.  Sinus infections are not as easily transmitted as other respiratory infection, however we still recommend that you avoid close contact with loved ones, especially the very young and elderly.  Remember to wash your hands thoroughly throughout the day as this is the number one way to prevent the spread of infection!  Home Care:  Only take medications as instructed by your medical team.  Do not take these medications with alcohol.  A steam or ultrasonic humidifier can help congestion.  You can place a towel over your head and breathe in the steam from hot water coming from a faucet.  Avoid close contacts especially the very young and  the elderly.  Cover your mouth when you cough or sneeze.  Always remember to wash your hands.  Get Help Right Away If:  You develop worsening fever or sinus pain.  You develop a severe head ache or visual changes.  Your symptoms persist after you have completed your treatment plan.  Make sure you  Understand these instructions.  Will watch your condition.  Will get help right away if you are not doing well or get worse.  Your e-visit answers were reviewed by a board certified advanced clinical practitioner to complete your personal care plan.  Depending on the condition, your plan could have included both over the counter or prescription medications.  If there is a problem please reply  once you have received a response from your provider.  Your safety is important to [place].  If you have drug allergies check your prescription carefully.    You can use MyChart to ask questions about today's visit, request a non-urgent call back, or ask for a work or school excuse for 24 hours related to this e-Visit. If it has been greater than 24 hours you will need to follow up with your provider, or enter a new e-Visit to address those concerns.  You will get an e-mail in the next two days asking about your experience.  I hope that your e-visit has been valuable and will speed your recovery. Thank you for using e-visits.

## 2018-01-26 MED FILL — ALPRAZolam 0.5 MG TABS: 0.5 | 30 days supply | Qty: 30 | Fill #1

## 2018-01-26 MED FILL — SERTRALINE HCL 50 MG TABLET: 50 | 30 days supply | Qty: 30 | Fill #0

## 2018-02-02 MED FILL — OTEZLA 30 MG TABS: 30 | 30 days supply | Qty: 60 | Fill #1

## 2018-02-04 DIAGNOSIS — E038 Other specified hypothyroidism: Secondary | ICD-10-CM | POA: Diagnosis not present

## 2018-02-04 DIAGNOSIS — Z0000 Encounter for general adult medical examination without abnormal findings: Secondary | ICD-10-CM | POA: Diagnosis not present

## 2018-02-04 DIAGNOSIS — R82998 Other abnormal findings in urine: Secondary | ICD-10-CM | POA: Diagnosis not present

## 2018-02-04 DIAGNOSIS — E559 Vitamin D deficiency, unspecified: Secondary | ICD-10-CM | POA: Diagnosis not present

## 2018-02-06 DIAGNOSIS — Z0000 Encounter for general adult medical examination without abnormal findings: Secondary | ICD-10-CM | POA: Diagnosis not present

## 2018-02-06 DIAGNOSIS — Z1389 Encounter for screening for other disorder: Secondary | ICD-10-CM | POA: Diagnosis not present

## 2018-02-06 DIAGNOSIS — E668 Other obesity: Secondary | ICD-10-CM | POA: Diagnosis not present

## 2018-02-06 DIAGNOSIS — M79646 Pain in unspecified finger(s): Secondary | ICD-10-CM | POA: Diagnosis not present

## 2018-02-06 DIAGNOSIS — Z23 Encounter for immunization: Secondary | ICD-10-CM | POA: Diagnosis not present

## 2018-02-06 DIAGNOSIS — E7849 Other hyperlipidemia: Secondary | ICD-10-CM | POA: Diagnosis not present

## 2018-02-06 DIAGNOSIS — L408 Other psoriasis: Secondary | ICD-10-CM | POA: Diagnosis not present

## 2018-02-06 DIAGNOSIS — K900 Celiac disease: Secondary | ICD-10-CM | POA: Diagnosis not present

## 2018-02-06 DIAGNOSIS — K219 Gastro-esophageal reflux disease without esophagitis: Secondary | ICD-10-CM | POA: Diagnosis not present

## 2018-02-06 DIAGNOSIS — I10 Essential (primary) hypertension: Secondary | ICD-10-CM | POA: Diagnosis not present

## 2018-02-06 DIAGNOSIS — E038 Other specified hypothyroidism: Secondary | ICD-10-CM | POA: Diagnosis not present

## 2018-02-11 MED FILL — ROSUVASTATIN CALCIUM 10 MG: 10 | 90 days supply | Qty: 90 | Fill #2

## 2018-02-11 MED FILL — LEVOTHYROXINE 88 MCG TABLET: 88 | 90 days supply | Qty: 90 | Fill #0

## 2018-02-12 DIAGNOSIS — Z1212 Encounter for screening for malignant neoplasm of rectum: Secondary | ICD-10-CM | POA: Diagnosis not present

## 2018-02-23 DIAGNOSIS — Z01419 Encounter for gynecological examination (general) (routine) without abnormal findings: Secondary | ICD-10-CM | POA: Diagnosis not present

## 2018-02-23 DIAGNOSIS — Z124 Encounter for screening for malignant neoplasm of cervix: Secondary | ICD-10-CM | POA: Diagnosis not present

## 2018-02-23 DIAGNOSIS — Z1231 Encounter for screening mammogram for malignant neoplasm of breast: Secondary | ICD-10-CM | POA: Diagnosis not present

## 2018-03-02 MED FILL — LOSARTAN-HCTZ 100-25 MG TAB: 100-25 | 90 days supply | Qty: 90 | Fill #1

## 2018-03-02 MED FILL — SERTRALINE HCL 50 MG TABLET: 50 | 30 days supply | Qty: 30 | Fill #0 | Status: TO

## 2018-03-04 DIAGNOSIS — L409 Psoriasis, unspecified: Secondary | ICD-10-CM | POA: Diagnosis not present

## 2018-03-04 DIAGNOSIS — B079 Viral wart, unspecified: Secondary | ICD-10-CM | POA: Diagnosis not present

## 2018-03-09 DIAGNOSIS — H53022 Refractive amblyopia, left eye: Secondary | ICD-10-CM | POA: Diagnosis not present

## 2018-03-09 DIAGNOSIS — H52201 Unspecified astigmatism, right eye: Secondary | ICD-10-CM | POA: Diagnosis not present

## 2018-03-09 DIAGNOSIS — H5211 Myopia, right eye: Secondary | ICD-10-CM | POA: Diagnosis not present

## 2018-03-09 DIAGNOSIS — H524 Presbyopia: Secondary | ICD-10-CM | POA: Diagnosis not present

## 2018-03-31 MED FILL — DULoxetine HCL 60 MG CPEP: 60 | 90 days supply | Qty: 90 | Fill #2

## 2018-04-01 DIAGNOSIS — B079 Viral wart, unspecified: Secondary | ICD-10-CM | POA: Diagnosis not present

## 2018-04-01 DIAGNOSIS — L709 Acne, unspecified: Secondary | ICD-10-CM | POA: Diagnosis not present

## 2018-04-01 MED FILL — CLINDAMYCIN PH 1% GEL: 1 | 30 days supply | Qty: 60 | Fill #0

## 2018-04-01 MED FILL — FLUOROURACIL 5% CREAM: 5 | 30 days supply | Qty: 40 | Fill #0

## 2018-04-10 MED FILL — SERTRALINE HCL 50 MG TABLET: 50 | 30 days supply | Qty: 30 | Fill #1 | Status: TO

## 2018-05-12 DIAGNOSIS — B079 Viral wart, unspecified: Secondary | ICD-10-CM | POA: Diagnosis not present

## 2018-05-13 MED FILL — SERTRALINE HCL 50 MG TABLET: 50 | 30 days supply | Qty: 30 | Fill #2 | Status: TO

## 2018-05-13 MED FILL — OTEZLA 30 MG TABS: 30 | 30 days supply | Qty: 60 | Fill #2

## 2018-05-13 MED FILL — ROSUVASTATIN CALCIUM 10 MG: 10 | 90 days supply | Qty: 90 | Fill #3

## 2018-05-13 MED FILL — LEVOTHYROXINE 88 MCG TABLET: 88 | 90 days supply | Qty: 90 | Fill #1

## 2018-06-08 MED FILL — LOSARTAN-HCTZ 100-25 MG TAB: 100-25 | 90 days supply | Qty: 90 | Fill #2 | Status: TO

## 2018-06-08 MED FILL — SERTRALINE HCL 50 MG TABLET: 50 | 30 days supply | Qty: 30 | Fill #3 | Status: TO

## 2018-06-30 MED FILL — DULoxetine HCL 60 MG CPEP: 60 | 90 days supply | Qty: 90 | Fill #0

## 2018-06-30 MED FILL — ALPRAZolam 0.5 MG TABS: 0.5 | 30 days supply | Qty: 30 | Fill #0

## 2018-07-14 MED FILL — ACYCLOVIR 400 MG TABLET: 400 | 30 days supply | Qty: 90 | Fill #0

## 2018-08-04 DIAGNOSIS — L409 Psoriasis, unspecified: Secondary | ICD-10-CM | POA: Diagnosis not present

## 2018-08-04 DIAGNOSIS — I10 Essential (primary) hypertension: Secondary | ICD-10-CM | POA: Diagnosis not present

## 2018-08-04 DIAGNOSIS — L0889 Other specified local infections of the skin and subcutaneous tissue: Secondary | ICD-10-CM | POA: Diagnosis not present

## 2018-08-04 MED FILL — MUPIROCIN 2% OINTMENT: 2 | 7 days supply | Qty: 22 | Fill #0

## 2018-08-04 MED FILL — ROSUVASTATIN CALCIUM 10 MG: 10 | 90 days supply | Qty: 90 | Fill #0

## 2018-08-04 MED FILL — ALPRAZolam 0.5 MG TABS: 0.5 | 30 days supply | Qty: 30 | Fill #1

## 2018-08-04 MED FILL — DOXYCYCLINE HYCLATE 100 MG: 100 | 7 days supply | Qty: 14 | Fill #0

## 2018-08-04 MED FILL — LEVOTHYROXINE 88 MCG TABLET: 88 | 90 days supply | Qty: 90 | Fill #2

## 2018-08-04 MED FILL — OTEZLA 30 MG TABS: 30 | 30 days supply | Qty: 60 | Fill #3

## 2018-08-04 MED FILL — SERTRALINE HCL 50 MG TABLET: 50 | 30 days supply | Qty: 30 | Fill #4 | Status: TO

## 2018-08-14 DIAGNOSIS — D229 Melanocytic nevi, unspecified: Secondary | ICD-10-CM | POA: Diagnosis not present

## 2018-08-14 DIAGNOSIS — L409 Psoriasis, unspecified: Secondary | ICD-10-CM | POA: Diagnosis not present

## 2018-08-14 MED FILL — BETAMETHASONE DP 0.05% CRM: 0.05 | 30 days supply | Qty: 45 | Fill #0

## 2018-08-25 DIAGNOSIS — Z5181 Encounter for therapeutic drug level monitoring: Secondary | ICD-10-CM | POA: Diagnosis not present

## 2019-01-09 ENCOUNTER — Emergency Department (HOSPITAL_COMMUNITY): Payer: 59

## 2019-01-09 ENCOUNTER — Encounter (HOSPITAL_COMMUNITY): Payer: Self-pay | Admitting: Emergency Medicine

## 2019-01-09 ENCOUNTER — Emergency Department (HOSPITAL_COMMUNITY)
Admission: EM | Admit: 2019-01-09 | Discharge: 2019-01-09 | Disposition: A | Discharge status: Home / Self Care | Payer: 59 | Attending: Emergency Medicine | Admitting: Emergency Medicine

## 2019-01-09 DIAGNOSIS — I10 Essential (primary) hypertension: Secondary | ICD-10-CM

## 2019-01-09 DIAGNOSIS — R0602 Shortness of breath: Secondary | ICD-10-CM | POA: Diagnosis not present

## 2019-01-09 DIAGNOSIS — Z79899 Other long term (current) drug therapy: Secondary | ICD-10-CM | POA: Diagnosis not present

## 2019-01-09 DIAGNOSIS — R079 Chest pain, unspecified: Secondary | ICD-10-CM | POA: Diagnosis present

## 2019-01-09 DIAGNOSIS — G43909 Migraine, unspecified, not intractable, without status migrainosus: Secondary | ICD-10-CM | POA: Diagnosis not present

## 2019-01-09 DIAGNOSIS — R519 Headache, unspecified: Secondary | ICD-10-CM

## 2019-01-09 DIAGNOSIS — E039 Hypothyroidism, unspecified: Secondary | ICD-10-CM | POA: Diagnosis not present

## 2019-01-09 DIAGNOSIS — R51 Headache: Secondary | ICD-10-CM | POA: Insufficient documentation

## 2019-01-09 DIAGNOSIS — R072 Precordial pain: Secondary | ICD-10-CM | POA: Diagnosis not present

## 2019-01-09 DIAGNOSIS — R05 Cough: Secondary | ICD-10-CM | POA: Diagnosis not present

## 2019-01-09 LAB — I-STAT BETA HCG BLOOD, ED (MC, WL, AP ONLY): I-stat hCG, quantitative: <5 m[IU]/mL (ref <5)

## 2019-01-09 LAB — BASIC METABOLIC PANEL
Anion gap: 10 (ref 5–15)
BUN: 11 mg/dL (ref 6–20)
CO2: 25 mmol/L (ref 22–32)
Calcium: 10.3 mg/dL (ref 8.9–10.3)
Chloride: 105 mmol/L (ref 98–111)
Creatinine, Ser: 0.6 mg/dL (ref 0.44–1.00)
GFR calc Af Amer: >60 mL/min (ref >60)
GFR calc non Af Amer: >60 mL/min (ref >60)
Glucose, Bld: 110 mg/dL — ABNORMAL HIGH (ref 70–99)
Potassium: 3.2 mmol/L — ABNORMAL LOW (ref 3.5–5.1)
Sodium: 140 mmol/L (ref 135–145)

## 2019-01-09 LAB — I-STAT TROPONIN, ED
Troponin i, poc: 0 ng/mL (ref 0.00–0.08)
Troponin i, poc: 0 ng/mL (ref 0.00–0.08)

## 2019-01-09 LAB — CBC
HCT: 38 % (ref 36.0–46.0)
Hemoglobin: 12.4 g/dL (ref 12.0–15.0)
MCH: 28.4 pg (ref 26.0–34.0)
MCHC: 32.6 g/dL (ref 30.0–36.0)
MCV: 87.2 fL (ref 80.0–100.0)
Platelets: 260 10*3/uL (ref 150–400)
RBC: 4.36 MIL/uL (ref 3.87–5.11)
RDW: 12.6 % (ref 11.5–15.5)
WBC: 6.2 10*3/uL (ref 4.0–10.5)
nRBC: 0 % (ref 0.0–0.2)

## 2019-01-09 MED ORDER — DIPHENHYDRAMINE HCL 50 MG/ML IJ SOLN
25.0000 mg | Freq: Once | INTRAMUSCULAR | Status: AC
  Administered 2019-01-09: 25 mg via INTRAVENOUS
  Filled 2019-01-09: qty 1

## 2019-01-09 MED ORDER — DEXAMETHASONE SODIUM PHOSPHATE 10 MG/ML IJ SOLN
10.0000 mg | Freq: Once | INTRAMUSCULAR | Status: AC
  Administered 2019-01-09: 10 mg via INTRAVENOUS
  Filled 2019-01-09: qty 1

## 2019-01-09 MED ORDER — HYDROMORPHONE HCL 1 MG/ML IJ SOLN
0.5000 mg | Freq: Once | INTRAMUSCULAR | Status: AC
  Administered 2019-01-09: 0.5 mg via INTRAVENOUS
  Filled 2019-01-09: qty 1

## 2019-01-09 MED ORDER — SODIUM CHLORIDE 0.9 % IV BOLUS
1000.0000 mL | Freq: Once | INTRAVENOUS | Status: AC
  Administered 2019-01-09: 1000 mL via INTRAVENOUS

## 2019-01-09 MED ORDER — METOCLOPRAMIDE HCL 5 MG/ML IJ SOLN
10.0000 mg | Freq: Once | INTRAMUSCULAR | Status: AC
  Administered 2019-01-09: 10 mg via INTRAVENOUS
  Filled 2019-01-09: qty 2

## 2019-01-09 MED ORDER — SODIUM CHLORIDE 0.9 % IV SOLN
INTRAVENOUS | Status: DC
  Administered 2019-01-09: 14:00:00 via INTRAVENOUS

## 2019-01-09 NOTE — ED Triage Notes (Signed)
Pt reports she has had intermittent chest pain to center and left chest since Thursday. Pt reports she has no shortness of breath, or dizziness. She has some nausea when the pain is present. Pt reports last night she had generalized abdominal pain and diarrhea X 1. She takes meds for hypertension but has been higher than normal.

## 2019-01-09 NOTE — Discharge Instructions (Signed)
Rest at home.  Do not recommend traveling to [place] by airplane this week.  Make an appointment to be seen by your regular doctor later this week.  Work-up for the chest pain without any acute findings.  Headache improved significantly with a migraine cocktail.  Will need additional follow-up for the blood pressure.  Return for any recurrent chest pain lasting 15 to 20 minutes or longer.  Return for any new or worse symptoms.

## 2019-01-09 NOTE — ED Notes (Signed)
Patient transported to X-ray 

## 2019-01-09 NOTE — ED Provider Notes (Signed)
[facility] EMERGENCY DEPARTMENT Provider Note   CSN: [id number] Arrival date & time: 01/09/19  1034     History   Chief Complaint Chief Complaint  Patient presents with  . Chest Pain    HPI Rebecca Carroll is a 48 y.o. female.  Patient presenting with 2 main complaints.  One is intermittent chest pain to the center and left side of the chest since Thursday.  It is occurred frequently.  No shortness of breath or dizziness.  Patient did have some nausea.  Also patient with complaint of headache that is one-sided.  No history of migraines.  Had some nausea as stated had little bit abdominal pain with diarrhea x1.  Patient has a history of hypertension.  She is taking hypertensive meds but blood pressures been higher than normal.     Past Medical History:  Diagnosis Date  . Anxiety   . Celiac disease   . Essential hypertension, benign   . Fibromyalgia   . GERD (gastroesophageal reflux disease)   . High cholesterol   . History of anemia   . Hypothyroidism   . Migraine   . Scalp psoriasis     Patient Active Problem List   Diagnosis Date Noted  . Vitamin D deficiency 06/09/2015  . Obesity 06/09/2015  . Elevated transaminase level 06/09/2015  . Heart palpitations 09/07/2013  . DOE (dyspnea on exertion) 09/01/2013  . 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 Allergic rhinitis 04/07/2013  . Allergic conjunctivitis 04/07/2013  . Unspecified visual disturbance 03/26/2013  . Celiac disease 02/17/2012  . GERD (gastroesophageal reflux disease) 12/26/2011  . Fibromyalgia 12/26/2011  . Essential hypertension, benign 12/26/2011  . Hyperlipidemia 12/26/2011  . Hypothyroidism 12/26/2011    Past Surgical History:  Procedure Laterality Date  . ENDOMETRIAL ABLATION    . ESOPHAGOGASTRODUODENOSCOPY    . TONSILLECTOMY       OB History   No obstetric history on file.      Home Medications    Prior to Admission medications   Medication Sig Start Date End Date Taking?  Authorizing Provider  acyclovir (ZOVIRAX) 400 MG tablet TAKE 1 TABLET (400 MG TOTAL) BY MOUTH 2 (TWO) TIMES DAILY. 08/11/15   Kathyrn Drown, MD  Apremilast (OTEZLA) 30 MG TABS Take 1 tablet by mouth 2 (two) times daily. 08/05/17   Tresa Garter, MD  azithromycin (ZITHROMAX) 250 MG tablet Take 2 tabs now then 1 daily times 4 days 11/17/17   Withrow, Elyse Jarvis, FNP  benzonatate (TESSALON PERLES) 100 MG capsule Take 1-2 capsules (100-200 mg total) every 8 (eight) hours as needed by mouth for cough. 11/17/17   Withrow, Elyse Jarvis, FNP  benzonatate (TESSALON PERLES) 100 MG capsule Take 1-2 capsules (100-200 mg total) by mouth every 8 (eight) hours as needed for cough. 01/12/18   Withrow, Elyse Jarvis, FNP  cholecalciferol (VITAMIN D) 1000 UNITS tablet Take 1,000 Units by mouth daily.    [provider]  DULoxetine (CYMBALTA) 60 MG capsule Take 1 capsule (60 mg total) by mouth daily. 06/09/15   Kathyrn Drown, MD  fluticasone (FLONASE) 50 MCG/ACT nasal spray Place 2 sprays into both nostrils daily. 01/12/18   Withrow, Elyse Jarvis, FNP  levothyroxine (SYNTHROID, LEVOTHROID) 88 MCG tablet Take 88 mcg by mouth daily before breakfast.    [provider]  losartan-hydrochlorothiazide (HYZAAR) 100-25 MG tablet Take 1 tablet by mouth daily.    [provider]  metoprolol succinate (TOPROL-XL) 25 MG 24 hr tablet Take 25 mg by  mouth daily.    [provider]  omeprazole (PRILOSEC) 20 MG capsule TAKE 1 CAPSULE BY MOUTH ONCE DAILY AS NEEDED FOR INDIGESTION 08/16/16   Kathyrn Drown, MD  predniSONE (DELTASONE) 5 MG tablet Take 1 tablet (5 mg total) as directed by mouth. sterapred generic taper 11/17/17   Withrow, Elyse Jarvis, FNP  predniSONE (DELTASONE) 5 MG tablet Take 1 tablet (5 mg total) by mouth as directed. sterapred generic 01/12/18   Withrow, Elyse Jarvis, FNP  rosuvastatin (CRESTOR) 10 MG tablet Take 10 mg by mouth daily.    [provider]    Family History Family History  Problem  Relation Age of Onset  . Lung cancer Maternal Grandfather   . Diabetes Son   . Heart disease Maternal Grandmother   . Heart disease Paternal Grandmother   . Diverticulosis Father   . Hypertension Father   . Lung cancer Maternal Aunt   . Cirrhosis Paternal Uncle     Social History Social History   Tobacco Use  . Smoking status: Never Smoker  . Smokeless tobacco: Never Used  Substance Use Topics  . Alcohol use: No  . Drug use: No     Allergies   Patient has no known allergies.   Review of Systems Review of Systems  Constitutional: Negative for chills and fever.  HENT: Negative for rhinorrhea and sore throat.   Eyes: Negative for visual disturbance.  Respiratory: Negative for cough and shortness of breath.   Cardiovascular: Positive for chest pain. Negative for leg swelling.  Gastrointestinal: Positive for abdominal pain and nausea. Negative for diarrhea and vomiting.  Genitourinary: Negative for dysuria.  Musculoskeletal: Negative for back pain and neck pain.  Skin: Negative for rash.  Neurological: Positive for headaches. Negative for dizziness and light-headedness.  Hematological: Does not bruise/bleed easily.  Psychiatric/Behavioral: Negative for confusion.     Physical Exam Updated Vital Signs BP (!) 142/87   Pulse 86   Temp 98 F (36.7 C) (Oral)   Resp 17   Ht 1.575 m (5\' 2" )   Wt 70.3 kg   SpO2 98%   BMI 28.35 kg/m   Physical Exam Vitals signs and nursing note reviewed.  Constitutional:      General: She is not in acute distress.    Appearance: She is well-developed.  HENT:     Head: Normocephalic and atraumatic.     Mouth/Throat:     Mouth: Mucous membranes are moist.  Eyes:     Extraocular Movements: Extraocular movements intact.     Conjunctiva/sclera: Conjunctivae normal.     Pupils: Pupils are equal, round, and reactive to light.  Neck:     Musculoskeletal: Neck supple.  Cardiovascular:     Rate and Rhythm: Normal rate and regular  rhythm.     Heart sounds: No murmur.  Pulmonary:     Effort: Pulmonary effort is normal. No respiratory distress.     Breath sounds: Normal breath sounds.  Abdominal:     Palpations: Abdomen is soft.     Tenderness: There is no abdominal tenderness.  Musculoskeletal:        General: No swelling.  Skin:    General: Skin is warm and dry.  Neurological:     General: No focal deficit present.     Mental Status: She is alert and oriented to person, place, and time.     Cranial Nerves: No cranial nerve deficit.     Sensory: No sensory deficit.     Motor: No  weakness.      ED Treatments / Results  Labs (all labs ordered are listed, but only abnormal results are displayed) Labs Reviewed  BASIC METABOLIC PANEL - Abnormal; Notable for the following components:      Result Value   Potassium 3.2 (*)    Glucose, Bld 110 (*)    All other components within normal limits  CBC  I-STAT TROPONIN, ED  I-STAT BETA HCG BLOOD, ED ([facility], [facility], [facility] ONLY)  I-STAT TROPONIN, ED    EKG EKG Interpretation  Date/Time:  Saturday January 09 2019 10:45:06 EST Ventricular Rate:  95 PR Interval:    QRS Duration: 96 QT Interval:  359 QTC Calculation: 452 R Axis:   -30 Text Interpretation:  Sinus rhythm Left ventricular hypertrophy Anterior Q waves, possibly due to LVH Confirmed by Fredia Sorrow [phone]) on 01/09/2019 10:50:13 AM Also confirmed by Fredia Sorrow [phone]), editor Philomena Doheny [phone])  on 01/09/2019 2:16:49 PM   Radiology Dg Chest 2 View  Result Date: 01/09/2019 CLINICAL DATA:  Cough and shortness of breath for 3 weeks. EXAM: CHEST - 2 VIEW COMPARISON:  02/20/2013 FINDINGS: The heart size and mediastinal contours are within normal limits. Both lungs are clear. The visualized skeletal structures are unremarkable. IMPRESSION: No active cardiopulmonary disease. Electronically Signed   By: Earle Gell M.D.   On: 01/09/2019 12:48   Ct Head Wo Contrast  Result Date: 01/09/2019 CLINICAL DATA:   Severe headache with nausea and dizziness. EXAM: CT HEAD WITHOUT CONTRAST TECHNIQUE: Contiguous axial images were obtained from the base of the skull through the vertex without intravenous contrast. COMPARISON:  MR brain dated January 28, 2013. FINDINGS: Brain: No evidence of acute infarction, hemorrhage, hydrocephalus, extra-axial collection or mass lesion/mass effect. Vascular: No hyperdense vessel or unexpected calcification. Skull: Normal. Negative for fracture or focal lesion. Sinuses/Orbits: Scattered paranasal sinus disease. No air-fluid levels. The mastoid air cells are clear. The orbits are unremarkable. Other: None. IMPRESSION: 1.  No acute intracranial abnormality. 2. Mild paranasal sinus disease. Electronically Signed   By: Titus Dubin M.D.   On: 01/09/2019 13:47    Procedures Procedures (including critical care time)  Medications Ordered in ED Medications  0.9 %  sodium chloride infusion ( Intravenous New Bag/Given 01/09/19 1358)  metoCLOPramide (REGLAN) injection 10 mg (10 mg Intravenous Given 01/09/19 1357)  diphenhydrAMINE (BENADRYL) injection 25 mg (25 mg Intravenous Given 01/09/19 1357)  dexamethasone (DECADRON) injection 10 mg (10 mg Intravenous Given 01/09/19 1357)  HYDROmorphone (DILAUDID) injection 0.5 mg (0.5 mg Intravenous Given 01/09/19 1357)  sodium chloride 0.9 % bolus 1,000 mL (1,000 mLs Intravenous New Bag/Given 01/09/19 1358)     Initial Impression / Assessment and Plan / ED Course  I have reviewed the triage vital signs and the nursing notes.  Pertinent labs & imaging results that were available during my care of the patient were reviewed by me and considered in my medical decision making (see chart for details).     Work-up for the chest pain negative troponins x2.  Do not feel that the chest discomfort is cardiac related.  Unless there is new or worse symptoms.  Headache not typical for your migraines but did respond very well to migraine headache cocktail.   Head CT was negative.  Would recommend rest this week.  Okay to work from home.  Would not recommend traveling to [place].  Would follow-up with your primary care doctor later this week.  Patient with a a long period of observation doing much  better.  Patient stable for discharge home.  Letter restricting travel for a week.  Patient able to work at home.  Also follow-up for blood pressure. Follow-up as directed.  Final Clinical Impressions(s) / ED Diagnoses   Final diagnoses:  Precordial pain  Essential hypertension  Headache disorder    ED Discharge Orders    None       Fredia Sorrow, MD 01/22/19 [phone]

## 2019-01-11 ENCOUNTER — Other Ambulatory Visit (HOSPITAL_COMMUNITY): Payer: Self-pay | Admitting: Internal Medicine

## 2019-01-11 ENCOUNTER — Other Ambulatory Visit: Payer: Self-pay | Admitting: Internal Medicine

## 2019-01-11 DIAGNOSIS — I10 Essential (primary) hypertension: Secondary | ICD-10-CM | POA: Diagnosis not present

## 2019-01-11 DIAGNOSIS — L409 Psoriasis, unspecified: Secondary | ICD-10-CM | POA: Diagnosis not present

## 2019-01-11 DIAGNOSIS — I517 Cardiomegaly: Secondary | ICD-10-CM

## 2019-01-15 ENCOUNTER — Ambulatory Visit (HOSPITAL_COMMUNITY): Payer: 59 | Attending: Cardiovascular Disease

## 2019-01-15 ENCOUNTER — Other Ambulatory Visit: Payer: Self-pay

## 2019-01-15 DIAGNOSIS — I517 Cardiomegaly: Secondary | ICD-10-CM

## 2019-01-15 DIAGNOSIS — I10 Essential (primary) hypertension: Secondary | ICD-10-CM | POA: Diagnosis not present

## 2019-02-13 ENCOUNTER — Telehealth: Payer: 59 | Admitting: Family

## 2019-02-13 DIAGNOSIS — J069 Acute upper respiratory infection, unspecified: Secondary | ICD-10-CM

## 2019-02-13 DIAGNOSIS — B9789 Other viral agents as the cause of diseases classified elsewhere: Secondary | ICD-10-CM | POA: Diagnosis not present

## 2019-02-13 DIAGNOSIS — J329 Chronic sinusitis, unspecified: Secondary | ICD-10-CM | POA: Diagnosis not present

## 2019-02-13 MED ORDER — BENZONATATE 100 MG PO CAPS: 100.0000 mg | ORAL_CAPSULE | Freq: Three times a day (TID) | ORAL | 0 refills | Status: DC | PRN

## 2019-02-13 NOTE — Progress Notes (Signed)
Greater than 5 minutes, yet less than 10 minutes of time have been spent researching, coordinating, and implementing care for this patient today.  Thank you for the details you included in the comment boxes. Those details are very helpful in determining the best course of treatment for you and help [place] to provide the best care.  We are sorry you are not feeling well.  Here is how we plan to help!  Based on what you have shared with me, it looks like you may have a viral upper respiratory infection or a "common cold".  Colds are caused by a large number of viruses; however, rhinovirus is the most common cause.   Symptoms of the common cold vary from person to person, with common symptoms including sore throat, cough, and malaise.  A low-grade fever of 100.4 may present, but is often uncommon.  Symptoms vary however, and are closely related to a person's age or underlying illnesses.  The most common symptoms associated with the common cold are nasal discharge or congestion, cough, sneezing, headache and pressure in the ears and face.  Cold symptoms usually persist for about 3 to 10 days, but can last up to 2 weeks.  It is important to know that colds do not cause serious illness or complications in most cases.    The common cold is transmitted from person to person, with the most common method of transmission being a person's hands.  The virus is able to live on the skin and can infect other persons for up to 2 hours after direct contact.  Also, colds are transmitted when someone coughs or sneezes; thus, it is important to cover the mouth to reduce this risk.  To keep the spread of the common cold at [facility], good hand hygiene is very important.  This is an infection that is most likely caused by a virus. There are no specific treatments for the common cold other than to help you with the symptoms until the infection runs its course.    For nasal congestion, you may use an oral decongestants such as Mucinex D  or if you have glaucoma or high blood pressure use plain Mucinex.  Saline nasal spray or nasal drops can help and can safely be used as often as needed for congestion.  For your congestion, if it becomes an issue I have prescribed Fluticasone nasal spray one spray in each nostril twice a day  If you do not have a history of heart disease, hypertension, diabetes or thyroid disease, prostate/bladder issues or glaucoma, you may also use Sudafed to treat nasal congestion.  It is highly recommended that you consult with a pharmacist or your primary care physician to ensure this medication is safe for you to take.     If you have a cough, you may use cough suppressants such as Delsym and Robitussin.  If you have glaucoma or high blood pressure, you can also use Coricidin HBP.   For cough I have prescribed for you A prescription cough medication called Tessalon Perles 100 mg. You may take 1-2 capsules every 8 hours as needed for cough  If you have a sore or scratchy throat, use a saltwater gargle-  to  teaspoon of salt dissolved in a 4-ounce to 8-ounce glass of warm water.  Gargle the solution for approximately 15-30 seconds and then spit.  It is important not to swallow the solution.  You can also use throat lozenges/cough drops and Chloraseptic spray to help with throat  pain or discomfort.  Warm or cold liquids can also be helpful in relieving throat pain.  For headache, pain or general discomfort, you can use Ibuprofen or Tylenol as directed.   Some authorities believe that zinc sprays or the use of Echinacea may shorten the course of your symptoms.   HOME CARE . Only take medications as instructed by your medical team. . Be sure to drink plenty of fluids. Water is fine as well as fruit juices, sodas and electrolyte beverages. You may want to stay away from caffeine or alcohol. If you are nauseated, try taking small sips of liquids. How do you know if you are getting enough fluid? Your urine should be a  pale yellow or almost colorless. . Get rest. . Taking a steamy shower or using a humidifier may help nasal congestion and ease sore throat pain. You can place a towel over your head and breathe in the steam from hot water coming from a faucet. . Using a saline nasal spray works much the same way. . Cough drops, hard candies and sore throat lozenges may ease your cough. . Avoid close contacts especially the very young and the elderly . Cover your mouth if you cough or sneeze . Always remember to wash your hands.   GET HELP RIGHT AWAY IF: . You develop worsening fever. . If your symptoms do not improve within 10 days . You develop yellow or green discharge from your nose over 3 days. . You have coughing fits . You develop a severe head ache or visual changes. . You develop shortness of breath, difficulty breathing or start having chest pain . Your symptoms persist after you have completed your treatment plan  MAKE SURE YOU   Understand these instructions.  Will watch your condition.  Will get help right away if you are not doing well or get worse.  Your e-visit answers were reviewed by a board certified advanced clinical practitioner to complete your personal care plan. Depending upon the condition, your plan could have included both over the counter or prescription medications. Please review your pharmacy choice. If there is a problem, you may call our nursing hot line at and have the prescription routed to another pharmacy. Your safety is important to [place]. If you have drug allergies check your prescription carefully.   You can use MyChart to ask questions about today's visit, request a non-urgent call back, or ask for a work or school excuse for 24 hours related to this e-Visit. If it has been greater than 24 hours you will need to follow up with your provider, or enter a new e-Visit to address those concerns. You will get an e-mail in the next two days asking about your experience.  I  hope that your e-visit has been valuable and will speed your recovery. Thank you for using e-visits.

## 2019-03-05 ENCOUNTER — Telehealth: Payer: Self-pay

## 2019-03-05 NOTE — Telephone Encounter (Signed)
Notes on file, referral sent to scheduling.  

## 2019-03-09 ENCOUNTER — Other Ambulatory Visit: Payer: Self-pay | Admitting: Obstetrics and Gynecology

## 2019-03-09 DIAGNOSIS — N63 Unspecified lump in unspecified breast: Secondary | ICD-10-CM

## 2019-04-15 ENCOUNTER — Ambulatory Visit
Admission: RE | Admit: 2019-04-15 | Discharge: 2019-04-15 | Disposition: A | Discharge status: Home / Self Care | Payer: 59 | Source: Ambulatory Visit | Attending: Obstetrics and Gynecology | Admitting: Obstetrics and Gynecology

## 2019-04-15 ENCOUNTER — Other Ambulatory Visit: Payer: Self-pay

## 2019-04-15 DIAGNOSIS — N63 Unspecified lump in unspecified breast: Secondary | ICD-10-CM

## 2019-06-16 ENCOUNTER — Encounter (HOSPITAL_COMMUNITY): Payer: Self-pay | Admitting: *Deleted

## 2019-06-16 ENCOUNTER — Emergency Department (HOSPITAL_COMMUNITY): Payer: Self-pay

## 2019-06-16 ENCOUNTER — Other Ambulatory Visit: Payer: Self-pay

## 2019-06-16 ENCOUNTER — Emergency Department (HOSPITAL_COMMUNITY)
Admission: EM | Admit: 2019-06-16 | Discharge: 2019-06-16 | Disposition: A | Discharge status: Home / Self Care | Payer: Self-pay | Attending: Emergency Medicine | Admitting: Emergency Medicine

## 2019-06-16 DIAGNOSIS — R072 Precordial pain: Secondary | ICD-10-CM | POA: Insufficient documentation

## 2019-06-16 DIAGNOSIS — E039 Hypothyroidism, unspecified: Secondary | ICD-10-CM | POA: Insufficient documentation

## 2019-06-16 DIAGNOSIS — Z79899 Other long term (current) drug therapy: Secondary | ICD-10-CM | POA: Insufficient documentation

## 2019-06-16 DIAGNOSIS — I10 Essential (primary) hypertension: Secondary | ICD-10-CM | POA: Insufficient documentation

## 2019-06-16 DIAGNOSIS — D649 Anemia, unspecified: Secondary | ICD-10-CM | POA: Insufficient documentation

## 2019-06-16 LAB — CBC
HCT: 34.2 % — ABNORMAL LOW (ref 36.0–46.0)
Hemoglobin: 11.5 g/dL — ABNORMAL LOW (ref 12.0–15.0)
MCH: 29.4 pg (ref 26.0–34.0)
MCHC: 33.6 g/dL (ref 30.0–36.0)
MCV: 87.5 fL (ref 80.0–100.0)
Platelets: 284 10*3/uL (ref 150–400)
RBC: 3.91 MIL/uL (ref 3.87–5.11)
RDW: 12.6 % (ref 11.5–15.5)
WBC: 8.1 10*3/uL (ref 4.0–10.5)
nRBC: 0 % (ref 0.0–0.2)

## 2019-06-16 LAB — I-STAT BETA HCG BLOOD, ED (MC, WL, AP ONLY): I-stat hCG, quantitative: <5 m[IU]/mL (ref <5)

## 2019-06-16 LAB — BASIC METABOLIC PANEL
Anion gap: 11 (ref 5–15)
BUN: 8 mg/dL (ref 6–20)
CO2: 30 mmol/L (ref 22–32)
Calcium: 9.8 mg/dL (ref 8.9–10.3)
Chloride: 99 mmol/L (ref 98–111)
Creatinine, Ser: 0.67 mg/dL (ref 0.44–1.00)
GFR calc Af Amer: >60 mL/min (ref >60)
GFR calc non Af Amer: >60 mL/min (ref >60)
Glucose, Bld: 78 mg/dL (ref 70–99)
Potassium: 3.3 mmol/L — ABNORMAL LOW (ref 3.5–5.1)
Sodium: 140 mmol/L (ref 135–145)

## 2019-06-16 LAB — TROPONIN I
Troponin I: <0.03 ng/mL (ref <0.03)
Troponin I: <0.03 ng/mL (ref <0.03)

## 2019-06-16 MED ORDER — SODIUM CHLORIDE 0.9% FLUSH: 3.0000 mL | Freq: Once | INTRAVENOUS | Status: DC

## 2019-06-16 MED ORDER — OMEPRAZOLE 20 MG PO CPDR: 20.0000 mg | DELAYED_RELEASE_CAPSULE | Freq: Every day | ORAL | 0 refills | Status: AC

## 2019-06-16 MED ORDER — POTASSIUM CHLORIDE CRYS ER 20 MEQ PO TBCR
40.0000 meq | EXTENDED_RELEASE_TABLET | Freq: Once | ORAL | Status: AC
  Administered 2019-06-16: 40 meq via ORAL
  Filled 2019-06-16: qty 2

## 2019-06-16 NOTE — ED Notes (Signed)
Updated on wait for treatment room. 

## 2019-06-16 NOTE — ED Provider Notes (Signed)
[facility] EMERGENCY DEPARTMENT Provider Note   CSN: [id number] Arrival date & time: 06/16/19  1726     History   Chief Complaint Chief Complaint  Patient presents with  . Chest Pain    HPI Rebecca Carroll is a 48 y.o. female.     HPI  Patient is a 48 year old female with past medical history of hyperlipidemia, hypertension, GERD, fibromyalgia, hypothyroidism, anxiety presenting for left-sided chest pain.  Patient reports that she has had some intermittent and nonexertional sharp anterior chest pain ever since January when she was evaluated in the emergency department for the same.  Last night it woke her up from sleep and ever since she is felt a "catching" in her upper throat.  She denies shortness of breath.  She denies dizziness, lightheadedness, syncope or presyncope.  Patient reports that she has been previous evaluated by her primary care provider who ordered an echocardiogram which showed some LVH.  She has not had a stress test and has never had a cardiac catheterization.  She has a history of CAD in grandparents of both sides.  No other first-degree relatives with CAD.  She is a never smoker.  She does not drink alcohol.  Patient denies any recent mobilization, hospitalization, recent surgery, hemoptysis, lower extremity edema or calf tenderness, estrogen use, history DVT/PE or cancer treatment.  Past Medical History:  Diagnosis Date  . Anxiety   . Celiac disease   . Essential hypertension, benign   . Fibromyalgia   . GERD (gastroesophageal reflux disease)   . High cholesterol   . History of anemia   . Hypothyroidism   . Migraine   . Scalp psoriasis     Patient Active Problem List   Diagnosis Date Noted  . Vitamin D deficiency 06/09/2015  . Obesity 06/09/2015  . Elevated transaminase level 06/09/2015  . Heart palpitations 09/07/2013  . DOE (dyspnea on exertion) 09/01/2013  . 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 Allergic rhinitis 04/07/2013  . Allergic  conjunctivitis 04/07/2013  . Unspecified visual disturbance 03/26/2013  . Celiac disease 02/17/2012  . GERD (gastroesophageal reflux disease) 12/26/2011  . Fibromyalgia 12/26/2011  . Essential hypertension, benign 12/26/2011  . Hyperlipidemia 12/26/2011  . Hypothyroidism 12/26/2011    Past Surgical History:  Procedure Laterality Date  . ENDOMETRIAL ABLATION    . ESOPHAGOGASTRODUODENOSCOPY    . TONSILLECTOMY       OB History   No obstetric history on file.      Home Medications    Prior to Admission medications   Medication Sig Start Date End Date Taking? Authorizing Provider  acyclovir (ZOVIRAX) 400 MG tablet TAKE 1 TABLET (400 MG TOTAL) BY MOUTH 2 (TWO) TIMES DAILY. 08/11/15   Kathyrn Drown, MD  Apremilast (OTEZLA) 30 MG TABS Take 1 tablet by mouth 2 (two) times daily. 08/05/17   Tresa Garter, MD  azithromycin (ZITHROMAX) 250 MG tablet Take 2 tabs now then 1 daily times 4 days 11/17/17   Withrow, Elyse Jarvis, FNP  benzonatate (TESSALON PERLES) 100 MG capsule Take 1-2 capsules (100-200 mg total) by mouth every 8 (eight) hours as needed for cough. 02/13/19   Withrow, Elyse Jarvis, FNP  cholecalciferol (VITAMIN D) 1000 UNITS tablet Take 1,000 Units by mouth daily.    [provider]  DULoxetine (CYMBALTA) 60 MG capsule Take 1 capsule (60 mg total) by mouth daily. 06/09/15   Kathyrn Drown, MD  fluticasone (FLONASE) 50 MCG/ACT nasal spray Place 2 sprays into both nostrils daily. 01/12/18  Withrow, Elyse Jarvis, FNP  levothyroxine (SYNTHROID, LEVOTHROID) 88 MCG tablet Take 88 mcg by mouth daily before breakfast.    [provider]  losartan-hydrochlorothiazide (HYZAAR) 100-25 MG tablet Take 1 tablet by mouth daily.    [provider]  metoprolol succinate (TOPROL-XL) 25 MG 24 hr tablet Take 25 mg by mouth daily.    [provider]  omeprazole (PRILOSEC) 20 MG capsule TAKE 1 CAPSULE BY MOUTH ONCE DAILY AS NEEDED FOR INDIGESTION Patient taking differently:  Take 20 mg by mouth daily as needed (for indigestion).  08/16/16   Kathyrn Drown, MD  predniSONE (DELTASONE) 5 MG tablet Take 1 tablet (5 mg total) as directed by mouth. sterapred generic taper 11/17/17   Withrow, Elyse Jarvis, FNP  predniSONE (DELTASONE) 5 MG tablet Take 1 tablet (5 mg total) by mouth as directed. sterapred generic 01/12/18   Withrow, Elyse Jarvis, FNP  rosuvastatin (CRESTOR) 10 MG tablet Take 10 mg by mouth daily.    [provider]    Family History Family History  Problem Relation Age of Onset  . Lung cancer Maternal Grandfather   . Diabetes Son   . Heart disease Maternal Grandmother   . Heart disease Paternal Grandmother   . Diverticulosis Father   . Hypertension Father   . Lung cancer Maternal Aunt   . Breast cancer Maternal Aunt        late 72s early 59s  . Cirrhosis Paternal Uncle   . Breast cancer Maternal Aunt        late 31s early 22s     Social History Social History   Tobacco Use  . Smoking status: Never Smoker  . Smokeless tobacco: Never Used  Substance Use Topics  . Alcohol use: No  . Drug use: No     Allergies   Patient has no known allergies.   Review of Systems Review of Systems  Constitutional: Negative for chills and fever.  HENT: Negative for congestion and sore throat.   Eyes: Negative for visual disturbance.  Respiratory: Positive for chest tightness. Negative for cough and shortness of breath.   Cardiovascular: Positive for chest pain. Negative for palpitations and leg swelling.  Gastrointestinal: Negative for abdominal pain, constipation, diarrhea, nausea and vomiting.  Genitourinary: Negative for dysuria and flank pain.  Musculoskeletal: Negative for back pain and myalgias.  Skin: Negative for rash.  Neurological: Negative for dizziness and light-headedness.     Physical Exam Updated Vital Signs BP 114/64   Pulse 61   Temp 98.4 F (36.9 C) (Oral)   Resp (!) 27   SpO2 96%   Physical Exam Vitals signs and nursing  note reviewed.  Constitutional:      General: She is not in acute distress.    Appearance: She is well-developed.  HENT:     Head: Normocephalic and atraumatic.  Eyes:     Conjunctiva/sclera: Conjunctivae normal.     Pupils: Pupils are equal, round, and reactive to light.  Neck:     Musculoskeletal: Normal range of motion and neck supple.  Cardiovascular:     Rate and Rhythm: Normal rate and regular rhythm.     Pulses:          Radial pulses are 2+ on the right side and 2+ on the left side.       Dorsalis pedis pulses are 2+ on the right side and 2+ on the left side.     Heart sounds: S1 normal and S2 normal. No murmur.  Comments: No lower extremity edema.  No calf tenderness. Pulmonary:     Effort: Pulmonary effort is normal.     Breath sounds: Normal breath sounds. No wheezing or rales.  Abdominal:     General: There is no distension.     Palpations: Abdomen is soft.     Tenderness: There is no abdominal tenderness. There is no guarding.  Musculoskeletal: Normal range of motion.        General: No deformity.  Lymphadenopathy:     Cervical: No cervical adenopathy.  Skin:    General: Skin is warm and dry.     Findings: No erythema or rash.  Neurological:     Mental Status: She is alert.     Comments: Cranial nerves grossly intact. Patient moves extremities symmetrically and with good coordination.  Psychiatric:        Behavior: Behavior normal.        Thought Content: Thought content normal.        Judgment: Judgment normal.      ED Treatments / Results  Labs (all labs ordered are listed, but only abnormal results are displayed) Labs Reviewed  BASIC METABOLIC PANEL - Abnormal; Notable for the following components:      Result Value   Potassium 3.3 (*)    All other components within normal limits  CBC - Abnormal; Notable for the following components:   Hemoglobin 11.5 (*)    HCT 34.2 (*)    All other components within normal limits  TROPONIN I  TROPONIN I   I-STAT BETA HCG BLOOD, ED ([facility], [facility], [facility] ONLY)    EKG EKG Interpretation  Date/Time:  Wednesday June 16 2019 17:41:15 EDT Ventricular Rate:  75 PR Interval:  160 QRS Duration: 100 QT Interval:  402 QTC Calculation: 448 R Axis:   -23 Text Interpretation:  Normal sinus rhythm Minimal voltage criteria for LVH, may be normal variant Cannot rule out Anterior infarct , age undetermined Abnormal ECG Since last tracing rate slower Otherwise no significant change Confirmed by Deno Etienne [phone]) on 06/16/2019 8:23:35 PM   Radiology Dg Chest 2 View  Result Date: 06/16/2019 CLINICAL DATA:  Sharp upper left-sided chest pain. EXAM: CHEST - 2 VIEW COMPARISON:  01/09/2019 FINDINGS: The heart size and mediastinal contours are within normal limits. Both lungs are clear. The visualized skeletal structures are unremarkable. IMPRESSION: No active cardiopulmonary disease. Electronically Signed   By: Constance Holster M.D.   On: 06/16/2019 19:23    Procedures Procedures (including critical care time)  Medications Ordered in ED Medications  sodium chloride flush (NS) 0.9 % injection 3 mL (has no administration in time range)  potassium chloride SA (K-DUR) CR tablet 40 mEq (has no administration in time range)     Initial Impression / Assessment and Plan / ED Course  I have reviewed the triage vital signs and the nursing notes.  Pertinent labs & imaging results that were available during my care of the patient were reviewed by me and considered in my medical decision making (see chart for details).        Differential diagnosis includes ACS, PE, thoracic aortic dissection, Boerhaave's syndrome, cardiac tamponade, pneumothorax, incarcerated diaphragmatic hernia, cholecystitis, esophageal spasm, gastroesophageal reflux, herpes zoster of the thorax, pericarditis, pneumonia, chest wall pain, costochondritis.   Doubt ACS, as delta troponin is negative, initial and repeat EKGs show no signs of ischemia,  infarction, or arrhythmia, and HEART score 2 (age, risk factors). Doubt PE as Well's score 0 and PERC  1 (age),  and patient not tachycardic. Doubt TAD by hx, CXR showed no widening mediastinum, and pulses equal in all extremities. Patient remained nontoxic appearing and in no acute distress during emergency department course. Vital signs stable in the emergency department. Therefore, doubt esophageal rupture, cardiac tamponade, or pneumothorax. Pericarditis less likely due to no preceding infectious symptoms and pain not improved in upright positions. Abnormal labs include slight anemia at 11.5, will have patient follow up with PCP. Patient to follow up with PCP for further follow up.   Patient a is understanding and agrees with the plan of care.  She is given her precautions for any worsening or constant chest pain, shortness of breath, dizziness, lightheadedness, syncope or presyncope.  Final Clinical Impressions(s) / ED Diagnoses   Final diagnoses:  Precordial pain  Normocytic anemia    ED Discharge Orders    None       Tamala Julian 06/16/19 [place], [place], DO 06/16/19 2316

## 2019-06-16 NOTE — ED Triage Notes (Signed)
TO ED for eval of intermittent sharp pain to left upper chest. Pain startles pt but no sob associated. No nausea or vomiting. No diaphoresis. Feels like pressure at this moment.

## 2019-06-16 NOTE — Discharge Instructions (Addendum)
Please read and follow all provided instructions.  Your diagnoses today include:  1. Precordial pain   2. Normocytic anemia     Tests performed today include: An EKG of your heart A chest x-ray Cardiac enzymes - a blood test for heart muscle damage Blood counts and electrolytes Vital signs. See below for your results today.   Medications prescribed:   Take any prescribed medications only as directed.  Follow-up instructions: Please follow-up with your primary care provider as soon as you can for further evaluation of your symptoms.   Return instructions:  SEEK IMMEDIATE MEDICAL ATTENTION IF: You have severe chest pain, especially if the pain is crushing or pressure-like and spreads to the arms, back, neck, or jaw, or if you have sweating, nausea (feeling sick to your stomach), or shortness of breath. THIS IS AN EMERGENCY. Don't wait to see if the pain will go away. Get medical help at once. Call 911 or 0 (operator). DO NOT drive yourself to the hospital.  Your chest pain gets worse and does not go away with rest.  You have an attack of chest pain lasting longer than usual, despite rest and treatment with the medications your caregiver has prescribed.  You wake from sleep with chest pain or shortness of breath. You feel dizzy or faint. You have chest pain not typical of your usual pain for which you originally saw your caregiver.  You have any other emergent concerns regarding your health.  Additional Information: Chest pain comes from many different causes. Your caregiver has diagnosed you as having chest pain that is not specific for one problem, but does not require admission.  You are at low risk for an acute heart condition or other serious illness.   Your vital signs today were: BP 114/73    Pulse 76    Temp 98.4 F (36.9 C) (Oral)    Resp 16    SpO2 97%  If your blood pressure (BP) was elevated above 135/85 this visit, please have this repeated by your doctor within one  month. --------------

## 2019-08-11 ENCOUNTER — Encounter: Payer: Self-pay | Admitting: Internal Medicine

## 2019-09-07 ENCOUNTER — Other Ambulatory Visit: Payer: Self-pay

## 2019-09-07 ENCOUNTER — Encounter: Payer: Self-pay | Admitting: Internal Medicine

## 2019-09-07 ENCOUNTER — Ambulatory Visit (AMBULATORY_SURGERY_CENTER): Payer: Self-pay

## 2019-09-07 VITALS — Ht 62.0 in | Wt 173.8 lb

## 2019-09-07 DIAGNOSIS — Z1211 Encounter for screening for malignant neoplasm of colon: Secondary | ICD-10-CM

## 2019-09-07 MED ORDER — NA SULFATE-K SULFATE-MG SULF 17.5-3.13-1.6 GM/177ML PO SOLN: 1.0000 | Freq: Once | ORAL | 0 refills | Status: AC

## 2019-09-07 NOTE — Progress Notes (Signed)
Denies allergies to eggs or soy products. Denies complication of anesthesia or sedation. Denies use of weight loss medication. Denies use of O2.   Emmi instructions given for colonoscopy.  A 15.00 coupon for Suprep was given to the patient.  

## 2019-09-20 ENCOUNTER — Encounter: Payer: Self-pay | Admitting: Internal Medicine

## 2019-10-07 ENCOUNTER — Encounter: Payer: Self-pay | Admitting: Internal Medicine

## 2020-03-13 ENCOUNTER — Ambulatory Visit
Admission: RE | Admit: 2020-03-13 | Discharge: 2020-03-13 | Disposition: A | Discharge status: Home / Self Care | Payer: 59 | Source: Ambulatory Visit | Attending: Internal Medicine | Admitting: Internal Medicine

## 2020-03-13 ENCOUNTER — Other Ambulatory Visit: Payer: Self-pay | Admitting: Internal Medicine

## 2020-03-13 DIAGNOSIS — M542 Cervicalgia: Secondary | ICD-10-CM

## 2020-03-21 ENCOUNTER — Other Ambulatory Visit: Payer: Self-pay | Admitting: Internal Medicine

## 2020-03-21 DIAGNOSIS — M542 Cervicalgia: Secondary | ICD-10-CM

## 2020-03-30 ENCOUNTER — Encounter: Payer: Self-pay | Admitting: Physician Assistant

## 2020-03-30 ENCOUNTER — Ambulatory Visit: Payer: 59 | Admitting: Physician Assistant

## 2020-03-30 ENCOUNTER — Other Ambulatory Visit: Payer: Self-pay

## 2020-03-30 DIAGNOSIS — L4050 Arthropathic psoriasis, unspecified: Secondary | ICD-10-CM | POA: Diagnosis not present

## 2020-03-30 DIAGNOSIS — L603 Nail dystrophy: Secondary | ICD-10-CM

## 2020-03-30 DIAGNOSIS — L409 Psoriasis, unspecified: Secondary | ICD-10-CM | POA: Diagnosis not present

## 2020-03-30 MED ORDER — OTEZLA 30 MG PO TABS: 30.0000 mg | ORAL_TABLET | Freq: Two times a day (BID) | ORAL | 6 refills | Status: DC

## 2020-03-30 MED ORDER — OTEZLA 10 & 20 & 30 MG PO TBPK: ORAL_TABLET | ORAL | 0 refills | Status: DC

## 2020-03-30 MED ORDER — CLOBETASOL PROPIONATE 0.05 % EX SOLN: 1.0000 "application " | Freq: Two times a day (BID) | CUTANEOUS | 5 refills | Status: DC

## 2020-03-30 NOTE — Patient Instructions (Signed)
Patient given [place] Pink Pack during today's visit.   Lot: [id number] Exp: Dec 2022

## 2020-03-30 NOTE — Progress Notes (Signed)
   Follow-Up Visit   Subjective  Rebecca Carroll is a 49 y.o. female who presents for the following: Psoriasis (Here today for Otezla refill and psoriasis follow-up.  Has not taken Otezla in 2 months now.  Stubbed her left great toe about a year ago and it seems to never have gone back to normal, could this be psoriasis in her toes. Also, does have joint fatigue in the evenings.). Psoriasis is flaring and itches significantly. Has not used any topicals for relief.  Toenail is sore to the touch and thick.   The following portions of the chart were reviewed this encounter and updated as appropriate: Tobacco  Allergies  Meds  Problems  Med Hx  Surg Hx  Fam Hx  Soc Hx      Objective  Well appearing patient in no apparent distress; mood and affect are within normal limits.  A focused examination was performed including face. Relevant physical exam findings are noted in the Assessment and Plan.   Objective  Left Ankle - Anterior, Left Knee - Anterior, Left Upper Arm - Anterior, Right Ankle - Anterior, Right Antecubital Fossa, Right Knee - Anterior: Red swollen joints...elbows, knees, ankles   Assessment & Plan  Onychoschizia Right Hallux Toe Nail Plate  Because it is tender I suggested that she have it removed  Psoriasis (8) Right Occipital Scalp; Mid Frontal Scalp; Mid Parietal Scalp (4); Mid Occipital Scalp (2)  clobetasol (TEMOVATE) 0.05 % external solution - Mid Frontal Scalp  Comprehensive metabolic panel - Mid Frontal Scalp  CBC with Differential/Platelet - Mid Frontal Scalp  QuantiFERON-TB Gold Plus - Mid Frontal Scalp  Sedimentation Rate - Mid Frontal Scalp  Ordered Medications: Apremilast (OTEZLA) 30 MG TABS /clobetasol scalp solution  Psoriatic arthritis (HCC) (6) Right Antecubital Fossa; Left Knee - Anterior; Right Knee - Anterior; Left Ankle - Anterior; Right Ankle - Anterior; Left Upper Arm - Anterior  Referral to rheumatologist

## 2020-03-31 ENCOUNTER — Ambulatory Visit
Admission: EM | Admit: 2020-03-31 | Discharge: 2020-03-31 | Disposition: A | Discharge status: Home / Self Care | Payer: 59 | Attending: Physician Assistant | Admitting: Physician Assistant

## 2020-03-31 ENCOUNTER — Ambulatory Visit (INDEPENDENT_AMBULATORY_CARE_PROVIDER_SITE_OTHER): Payer: 59

## 2020-03-31 ENCOUNTER — Other Ambulatory Visit: Payer: Self-pay

## 2020-03-31 ENCOUNTER — Encounter: Payer: Self-pay | Admitting: Emergency Medicine

## 2020-03-31 DIAGNOSIS — W19XXXA Unspecified fall, initial encounter: Secondary | ICD-10-CM

## 2020-03-31 DIAGNOSIS — M79671 Pain in right foot: Secondary | ICD-10-CM

## 2020-03-31 DIAGNOSIS — M25512 Pain in left shoulder: Secondary | ICD-10-CM

## 2020-03-31 MED ORDER — METHOCARBAMOL 500 MG PO TABS: 500.0000 mg | ORAL_TABLET | Freq: Two times a day (BID) | ORAL | 0 refills | Status: DC

## 2020-03-31 MED ORDER — MELOXICAM 7.5 MG PO TABS: 7.5000 mg | ORAL_TABLET | Freq: Every day | ORAL | 0 refills | Status: DC

## 2020-03-31 NOTE — ED Triage Notes (Signed)
Patient c/o fall this morning. She states she missed the bottom few steps and fell injuring her left shoulder and right foot. She is c/o pain to both left shoulder and right foot.

## 2020-03-31 NOTE — Discharge Instructions (Signed)
Xray negative for fracture/dislocation. Left shoulder showed some arthritis. Start Mobic. Do not take ibuprofen (motrin/advil)/ naproxen (aleve) while on mobic. Robaxin as needed, this can make you drowsy, so do not take if you are going to drive, operate heavy machinery, or make important decisions. Ice/heat compresses as needed. This can take up to 3-4 weeks to completely resolve, but you should be feeling better each week. Follow up with PCP/orthopedics if symptoms worsen, changes for reevaluation.

## 2020-03-31 NOTE — ED Provider Notes (Signed)
[facility]    CSN: [id number] Arrival date & time: 03/31/20  1446      History   Chief Complaint Chief Complaint  Patient presents with  . Fall  . Shoulder Pain    left  . Foot Pain    right    HPI Rebecca Carroll is a 49 y.o. female.   49 year old female comes in for left shoulder pain, right foot pain after fall this morning.  States missed few steps, causing her to fall forward, landing with her left arm outstretched, and right foot under her buttock.  Denies head injury, loss of consciousness.  She has diffuse soreness to the left shoulder with decreased range of motion.  Denies swelling, radiation of pain.  Denies numbness, tingling.  States right foot pain with swelling, and diffuse tenderness.  Pain present at rest, worse with range of motion and weightbearing.  Has been limping due to the pain.     Past Medical History:  Diagnosis Date  . Allergy   . Anemia   . Anxiety   . Arthritis   . Celiac disease   . Essential hypertension, benign   . Fibromyalgia   . GERD (gastroesophageal reflux disease)   . High cholesterol   . History of anemia   . Hypothyroidism   . Migraine   . Scalp psoriasis     Patient Active Problem List   Diagnosis Date Noted  . Vitamin D deficiency 06/09/2015  . Obesity 06/09/2015  . Elevated transaminase level 06/09/2015  . Heart palpitations 09/07/2013  . DOE (dyspnea on exertion) 09/01/2013  . 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 Allergic rhinitis 04/07/2013  . Allergic conjunctivitis 04/07/2013  . Unspecified visual disturbance 03/26/2013  . Celiac disease 02/17/2012  . GERD (gastroesophageal reflux disease) 12/26/2011  . Fibromyalgia 12/26/2011  . Essential hypertension, benign 12/26/2011  . Hyperlipidemia 12/26/2011  . Hypothyroidism 12/26/2011    Past Surgical History:  Procedure Laterality Date  . ENDOMETRIAL ABLATION    . ESOPHAGOGASTRODUODENOSCOPY    . ROTATOR CUFF REPAIR Left   . TONSILLECTOMY      OB  History   No obstetric history on file.      Home Medications    Prior to Admission medications   Medication Sig Start Date End Date Taking? Authorizing Provider  acyclovir (ZOVIRAX) 400 MG tablet TAKE 1 TABLET (400 MG TOTAL) BY MOUTH 2 (TWO) TIMES DAILY. 08/11/15  Yes Luking, Scott A, MD  Apremilast (OTEZLA) 10 & 20 & 30 MG TBPK Take as directed per package instructions. 03/30/20  Yes Sheffield, Kelli R, PA-C  cholecalciferol (VITAMIN D) 1000 UNITS tablet Take 1,000 Units by mouth daily.   Yes [provider]  clobetasol (TEMOVATE) 0.05 % external solution Apply 1 application topically 2 (two) times daily. 03/30/20  Yes Sheffield, Ronalee Red, PA-C  DULoxetine (CYMBALTA) 60 MG capsule Take 1 capsule (60 mg total) by mouth daily. 06/09/15  Yes Luking, Elayne Snare, MD  levothyroxine (SYNTHROID, LEVOTHROID) 88 MCG tablet Take 88 mcg by mouth daily before breakfast.   Yes [provider]  losartan-hydrochlorothiazide (HYZAAR) 100-25 MG tablet Take 1 tablet by mouth daily.   Yes [provider]  metoprolol succinate (TOPROL-XL) 25 MG 24 hr tablet Take 25 mg by mouth daily.   Yes [provider]  omeprazole (PRILOSEC) 20 MG capsule Take 1 capsule (20 mg total) by mouth daily. 06/16/19  Yes Valere Dross, Alyssa B, PA-C  rosuvastatin (CRESTOR) 10 MG tablet Take 10 mg by mouth daily.  Yes [provider]  Apremilast (OTEZLA) 30 MG TABS Take 1 tablet (30 mg total) by mouth 2 (two) times daily. 03/30/20   Sheffield, Ronalee Red, PA-C  meloxicam (MOBIC) 7.5 MG tablet Take 1 tablet (7.5 mg total) by mouth daily. 03/31/20   Tasia Catchings, Royal Beirne V, PA-C  methocarbamol (ROBAXIN) 500 MG tablet Take 1 tablet (500 mg total) by mouth 2 (two) times daily. 03/31/20   Ok Edwards, PA-C    Family History Family History  Problem Relation Age of Onset  . Lung cancer Maternal Grandfather   . Diabetes Son   . Heart disease Maternal Grandmother   . Heart disease Paternal Grandmother   . Diverticulosis Father     . Hypertension Father   . Lung cancer Maternal Aunt   . Breast cancer Maternal Aunt        late 72s early 22s  . Cirrhosis Paternal Uncle   . Colon cancer Paternal Uncle   . Breast cancer Maternal Aunt        late 43s early 34s   . Stomach cancer Neg Hx   . Rectal cancer Neg Hx   . Esophageal cancer Neg Hx     Social History Social History   Tobacco Use  . Smoking status: Never Smoker  . Smokeless tobacco: Never Used  Substance Use Topics  . Alcohol use: No  . Drug use: No     Allergies   Patient has no known allergies.   Review of Systems Review of Systems  Reason unable to perform ROS: See HPI as above.     Physical Exam Triage Vital Signs ED Triage Vitals  Enc Vitals Group     BP 03/31/20 1514 118/78     Pulse Rate 03/31/20 1514 84     Resp 03/31/20 1514 16     Temp 03/31/20 1514 98.2 F (36.8 C)     Temp Source 03/31/20 1514 Oral     SpO2 03/31/20 1514 98 %     Weight 03/31/20 1522 177 lb (80.3 kg)     Height 03/31/20 1522 5\' 2"  (1.575 m)     Head Circumference --      Peak Flow --      Pain Score 03/31/20 1521 7     Pain Loc --      Pain Edu? --      Excl. in [place]? --    No data found.  Updated Vital Signs BP 118/78 (BP Location: Left Arm)   Pulse 84   Temp 98.2 F (36.8 C) (Oral)   Resp 16   Ht 5\' 2"  (1.575 m)   Wt 177 lb (80.3 kg)   SpO2 98%   BMI 32.37 kg/m   Visual Acuity Right Eye Distance:   Left Eye Distance:   Bilateral Distance:    Right Eye Near:   Left Eye Near:    Bilateral Near:     Physical Exam Constitutional:      General: She is not in acute distress.    Appearance: Normal appearance. She is well-developed. She is not toxic-appearing or diaphoretic.  HENT:     Head: Normocephalic and atraumatic.  Eyes:     Conjunctiva/sclera: Conjunctivae normal.     Pupils: Pupils are equal, round, and reactive to light.  Pulmonary:     Effort: Pulmonary effort is normal. No respiratory distress.     Comments: Speaking in  full sentences without difficulty Musculoskeletal:     Cervical back: Normal range of  motion and neck supple. No pain with movement, spinous process tenderness or muscular tenderness.     Comments: No obvious swelling, erythema, warmth, deformity.  Diffuse tenderness to palpation of left middle trapezius.  No tenderness to palpation along clavicle.  Point worsening tenderness to anterior shoulder.  No tenderness to palpation along humerus.  Abduction to 90 degrees.  Full adduction, internal and external rotation.  Strength deferred. NVI  Right foot with swelling, contusion to the lateral and medial foot.  Diffuse tenderness to palpation of the foot.  No tenderness to palpation of the ankles.  Decreased range of motion of ankle. NVI  Skin:    General: Skin is warm and dry.  Neurological:     Mental Status: She is alert and oriented to person, place, and time.      UC Treatments / Results  Labs (all labs ordered are listed, but only abnormal results are displayed) Labs Reviewed - No data to display  EKG   Radiology DG Shoulder Left  Result Date: 03/31/2020 CLINICAL DATA:  49 year old female with a history of a fall EXAM: LEFT SHOULDER - 2+ VIEW COMPARISON:  None. FINDINGS: No acute displaced fracture. Glenohumeral joint is congruent. Degenerative changes of the acromioclavicular joint. Degenerative changes at the glenohumeral joint. IMPRESSION: No acute displaced fracture identified. Degenerative changes at the glenohumeral joint and [facility] joint Electronically Signed   By: Corrie Mckusick D.O.   On: 03/31/2020 16:00   DG Foot Complete Right  Result Date: 03/31/2020 CLINICAL DATA:  49 year old female with a history of pain after a fall EXAM: RIGHT FOOT COMPLETE - 3+ VIEW COMPARISON:  None. FINDINGS: No acute displaced fracture. Degenerative changes of the interphalangeal joints. No focal soft tissue swelling. Minimal enthesopathic changes at the plantar fascia insertion. No radiopaque foreign  body. IMPRESSION: Negative for acute bony abnormality Electronically Signed   By: Corrie Mckusick D.O.   On: 03/31/2020 16:01    Procedures Procedures (including critical care time)  Medications Ordered in UC Medications - No data to display  Initial Impression / Assessment and Plan / UC Course  I have reviewed the triage vital signs and the nursing notes.  Pertinent labs & imaging results that were available during my care of the patient were reviewed by me and considered in my medical decision making (see chart for details).    Given point bony tenderness to anterior left shoulder, will obtain x-ray for further evaluation.  Given swelling and contusion to the right foot, will obtain x-ray for further evaluation.  Shoulder and foot x-ray negative for fracture or dislocation.  Will provide symptomatic treatment at this time.  Expected course of healing discussed.  Return precautions given.  Patient expresses understanding and agrees to plan.  Final Clinical Impressions(s) / UC Diagnoses   Final diagnoses:  Acute pain of left shoulder  Right foot pain  Fall, initial encounter    ED Prescriptions    Medication Sig Dispense Auth. Provider   meloxicam (MOBIC) 7.5 MG tablet Take 1 tablet (7.5 mg total) by mouth daily. 10 tablet Franki Alcaide V, PA-C   methocarbamol (ROBAXIN) 500 MG tablet Take 1 tablet (500 mg total) by mouth 2 (two) times daily. 20 tablet Ok Edwards, PA-C     PDMP not reviewed this encounter.   Ok Edwards, PA-C 03/31/20 1652

## 2020-04-01 LAB — CBC WITH DIFFERENTIAL/PLATELET
Absolute Monocytes: 563 cells/uL (ref 200–950)
Basophils Absolute: 60 cells/uL (ref 0–200)
Basophils Relative: 0.9 %
Eosinophils Absolute: 228 cells/uL (ref 15–500)
Eosinophils Relative: 3.4 %
HCT: 36.5 % (ref 35.0–45.0)
Hemoglobin: 12.1 g/dL (ref 11.7–15.5)
Lymphs Abs: 1561 cells/uL (ref 850–3900)
MCH: 29.4 pg (ref 27.0–33.0)
MCHC: 33.2 g/dL (ref 32.0–36.0)
MCV: 88.6 fL (ref 80.0–100.0)
MPV: 11.9 fL (ref 7.5–12.5)
Monocytes Relative: 8.4 %
Neutro Abs: 4288 cells/uL (ref 1500–7800)
Neutrophils Relative %: 64 %
Platelets: 253 10*3/uL (ref 140–400)
RBC: 4.12 10*6/uL (ref 3.80–5.10)
RDW: 14 % (ref 11.0–15.0)
Total Lymphocyte: 23.3 %
WBC: 6.7 10*3/uL (ref 3.8–10.8)

## 2020-04-01 LAB — COMPREHENSIVE METABOLIC PANEL
AG Ratio: 1.3 (calc) (ref 1.0–2.5)
ALT: 25 U/L (ref 6–29)
AST: 26 U/L (ref 10–35)
Albumin: 4.2 g/dL (ref 3.6–5.1)
Alkaline phosphatase (APISO): 85 U/L (ref 37–153)
BUN: 13 mg/dL (ref 7–25)
CO2: 32 mmol/L (ref 20–32)
Calcium: 9.4 mg/dL (ref 8.6–10.4)
Chloride: 97 mmol/L — ABNORMAL LOW (ref 98–110)
Creat: 0.81 mg/dL (ref 0.50–1.05)
Globulin: 3.3 g/dL (calc) (ref 1.9–3.7)
Glucose, Bld: 200 mg/dL — ABNORMAL HIGH (ref 65–99)
Potassium: 3.4 mmol/L — ABNORMAL LOW (ref 3.5–5.3)
Sodium: 140 mmol/L (ref 135–146)
Total Bilirubin: 0.4 mg/dL (ref 0.2–1.2)
Total Protein: 7.5 g/dL (ref 6.1–8.1)

## 2020-04-01 LAB — QUANTIFERON-TB GOLD PLUS
Mitogen-NIL: >10 IU/mL
NIL: 0.02 IU/mL
QuantiFERON-TB Gold Plus: NEGATIVE
TB1-NIL: 0.04 IU/mL
TB2-NIL: 0.04 IU/mL

## 2020-04-01 LAB — SEDIMENTATION RATE

## 2020-04-01 LAB — SED RATE MANUAL WEST RFLX: SED RATE BY MODIFIED WESTERGREN,MANUAL: 45 mm/h — ABNORMAL HIGH (ref 0–30)

## 2020-04-03 ENCOUNTER — Telehealth: Payer: Self-pay

## 2020-04-03 NOTE — Telephone Encounter (Signed)
Fax receive from [employer] needing [facility] collaborating Physician for the patient's prescription of Rutherford Nail.  Information filled out and faxed back to [facility] @ [phone].

## 2020-04-04 ENCOUNTER — Telehealth: Payer: Self-pay

## 2020-04-04 NOTE — Telephone Encounter (Signed)
-----   Message from Warren Danes, [place] sent at 04/04/2020 12:18 PM EDT ----- TB negative. Was she fasting at the time of the blood draw? 200 if so PCP!!! ASAP... Sed rate up slightly.

## 2020-04-04 NOTE — Telephone Encounter (Signed)
Patient aware of lab results, she was not fasting the morning of her test per patient drank Dr. Malachi Bonds and maybe cheese for breakfast. Also she is aware of the referral process on hold due to epic.

## 2020-04-05 ENCOUNTER — Telehealth: Payer: Self-pay | Admitting: *Deleted

## 2020-04-05 NOTE — Telephone Encounter (Signed)
Sent to plan via cover my meds    Jearld Lesch (Key: [id number])  Your information has been sent to [facility].  Enroll in patient services  Create Enrollment If you would like to submit an enrollment request for this

## 2020-04-07 ENCOUNTER — Telehealth: Payer: Self-pay | Admitting: *Deleted

## 2020-04-07 NOTE — Telephone Encounter (Signed)
PER COVER MY MEDS OTEZLA APPROVED THROUGH 04/05/2021 PA# [id number]

## 2020-04-15 ENCOUNTER — Ambulatory Visit
Admission: RE | Admit: 2020-04-15 | Discharge: 2020-04-15 | Disposition: A | Discharge status: Home / Self Care | Payer: 59 | Source: Ambulatory Visit | Attending: Internal Medicine | Admitting: Internal Medicine

## 2020-04-15 DIAGNOSIS — M542 Cervicalgia: Secondary | ICD-10-CM

## 2020-05-18 ENCOUNTER — Encounter: Payer: Self-pay | Admitting: Internal Medicine

## 2020-05-19 ENCOUNTER — Telehealth: Payer: Self-pay | Admitting: *Deleted

## 2020-05-19 NOTE — Telephone Encounter (Signed)
Phone call from patient and she wants [place] to fax her blood work results to [employer] at [employer] fax# [phone]

## 2020-06-05 ENCOUNTER — Ambulatory Visit: Payer: 59

## 2020-06-05 ENCOUNTER — Other Ambulatory Visit: Payer: Self-pay

## 2020-06-05 VITALS — Ht 62.0 in | Wt 171.8 lb

## 2020-06-05 DIAGNOSIS — Z1211 Encounter for screening for malignant neoplasm of colon: Secondary | ICD-10-CM

## 2020-06-05 MED ORDER — SUTAB 1479-225-188 MG PO TABS: 1.0000 | ORAL_TABLET | ORAL | 0 refills | Status: DC

## 2020-06-05 NOTE — Progress Notes (Signed)
Denies allergies to eggs or soy products. Denies complication of anesthesia or sedation. Denies use of weight loss medication. Denies use of O2.   Emmi instructions given for colonoscopy.  Patient is scheduled for Covid Test 06/15/20.

## 2020-06-12 ENCOUNTER — Encounter: Payer: Self-pay | Admitting: Internal Medicine

## 2020-06-15 ENCOUNTER — Ambulatory Visit (INDEPENDENT_AMBULATORY_CARE_PROVIDER_SITE_OTHER): Payer: 59

## 2020-06-15 ENCOUNTER — Other Ambulatory Visit: Payer: Self-pay | Admitting: Internal Medicine

## 2020-06-15 DIAGNOSIS — Z1159 Encounter for screening for other viral diseases: Secondary | ICD-10-CM

## 2020-06-16 LAB — SARS CORONAVIRUS 2 (TAT 6-24 HRS): SARS Coronavirus 2: NEGATIVE

## 2020-06-20 ENCOUNTER — Encounter: Payer: Self-pay | Admitting: Internal Medicine

## 2020-06-20 ENCOUNTER — Other Ambulatory Visit: Payer: Self-pay

## 2020-06-20 ENCOUNTER — Other Ambulatory Visit (INDEPENDENT_AMBULATORY_CARE_PROVIDER_SITE_OTHER): Payer: 59

## 2020-06-20 ENCOUNTER — Ambulatory Visit (AMBULATORY_SURGERY_CENTER): Payer: 59 | Admitting: Internal Medicine

## 2020-06-20 VITALS — BP 128/86 | HR 77 | Temp 96.0°F | Resp 13 | Ht 62.0 in | Wt 171.8 lb

## 2020-06-20 DIAGNOSIS — D124 Benign neoplasm of descending colon: Secondary | ICD-10-CM

## 2020-06-20 DIAGNOSIS — Z1211 Encounter for screening for malignant neoplasm of colon: Secondary | ICD-10-CM

## 2020-06-20 DIAGNOSIS — K635 Polyp of colon: Secondary | ICD-10-CM | POA: Diagnosis not present

## 2020-06-20 DIAGNOSIS — D125 Benign neoplasm of sigmoid colon: Secondary | ICD-10-CM

## 2020-06-20 DIAGNOSIS — K900 Celiac disease: Secondary | ICD-10-CM

## 2020-06-20 LAB — IBC + FERRITIN
Ferritin: 15.8 ng/mL (ref 10.0–291.0)
Iron: 82 ug/dL (ref 42–145)
Saturation Ratios: 17.4 % — ABNORMAL LOW (ref 20.0–50.0)
Transferrin: 336 mg/dL (ref 212.0–360.0)

## 2020-06-20 MED ORDER — SODIUM CHLORIDE 0.9 % IV SOLN: 500.0000 mL | Freq: Once | INTRAVENOUS | Status: DC

## 2020-06-20 NOTE — Progress Notes (Signed)
VS-CW  Pt's states no medical or surgical changes since previsit or office visit.  

## 2020-06-20 NOTE — Progress Notes (Signed)
Called to room to assist during endoscopic procedure.  Patient ID and intended procedure confirmed with present staff. Received instructions for my participation in the procedure from the performing physician.  

## 2020-06-20 NOTE — Patient Instructions (Signed)
Try to read all of the handouts given to you  By your recovery room nurse.  YOU HAD AN ENDOSCOPIC PROCEDURE TODAY AT [facility]:   Refer to the procedure report that was given to you for any specific questions about what was found during the examination.  If the procedure report does not answer your questions, please call your gastroenterologist to clarify.  If you requested that your care partner not be given the details of your procedure findings, then the procedure report has been included in a sealed envelope for you to review at your convenience later.  YOU SHOULD EXPECT: Some feelings of bloating in the abdomen. Passage of more gas than usual.  Walking can help get rid of the air that was put into your GI tract during the procedure and reduce the bloating. If you had a lower endoscopy (such as a colonoscopy or flexible sigmoidoscopy) you may notice spotting of blood in your stool or on the toilet paper. If you underwent a bowel prep for your procedure, you may not have a normal bowel movement for a few days.  Please Note:  You might notice some irritation and congestion in your nose or some drainage.  This is from the oxygen used during your procedure.  There is no need for concern and it should clear up in a day or so.  SYMPTOMS TO REPORT IMMEDIATELY:   Following lower endoscopy (colonoscopy or flexible sigmoidoscopy):  Excessive amounts of blood in the stool  Significant tenderness or worsening of abdominal pains  Swelling of the abdomen that is new, acute  Fever of 100F or higher   For urgent or emergent issues, a gastroenterologist can be reached at any hour by calling [phone]. Do not use MyChart messaging for urgent concerns.    DIET:  We do recommend a small meal at first, but then you may proceed to your regular diet.  Drink plenty of fluids but you should avoid alcoholic beverages for 24 hours.  ACTIVITY:  You should plan to take it easy for the rest of  today and you should NOT DRIVE or use heavy machinery until tomorrow (because of the sedation medicines used during the test).    FOLLOW UP: Our staff will call the number listed on your records 48-72 hours following your procedure to check on you and address any questions or concerns that you may have regarding the information given to you following your procedure. If we do not reach you, we will leave a message.  We will attempt to reach you two times.  During this call, we will ask if you have developed any symptoms of COVID 19. If you develop any symptoms (ie: fever, flu-like symptoms, shortness of breath, cough etc.) before then, please call [phone].  If you test positive for Covid 19 in the 2 weeks post procedure, please call and report this information to [place].    If any biopsies were taken you will be contacted by phone or by letter within the next 1-3 weeks.  Please call us at [phone] if you have not heard about the biopsies in 3 weeks.    SIGNATURES/CONFIDENTIALITY: You and/or your care partner have signed paperwork which will be entered into your electronic medical record.  These signatures attest to the fact that that the information above on your After Visit Summary has been reviewed and is understood.  Full responsibility of the confidentiality of this discharge information lies with you and/or your care-partner.

## 2020-06-20 NOTE — Progress Notes (Signed)
Report to PACU, RN, vss, BBS= Clear.  

## 2020-06-20 NOTE — Op Note (Signed)
Reading Patient Name: Rebecca Carroll Procedure Date: 06/20/2020 2:08 PM MRN: [id number] Endoscopist: Jerene Bears , MD Age: 49 Referring MD:  Date of Birth: 1971-01-04 Gender: Female Account #: [id number] Procedure:                Colonoscopy Indications:              Screening for colorectal malignant neoplasm, This                            is the patient's first colonoscopy Medicines:                Monitored Anesthesia Care Procedure:                Pre-Anesthesia Assessment:                           - Prior to the procedure, a History and Physical                            was performed, and patient medications and                            allergies were reviewed. The patient's tolerance of                            previous anesthesia was also reviewed. The risks                            and benefits of the procedure and the sedation                            options and risks were discussed with the patient.                            All questions were answered, and informed consent                            was obtained. Prior Anticoagulants: The patient has                            taken no previous anticoagulant or antiplatelet                            agents. ASA Grade Assessment: II - A patient with                            mild systemic disease. After reviewing the risks                            and benefits, the patient was deemed in                            satisfactory condition to undergo the procedure.  After obtaining informed consent, the colonoscope                            was passed under direct vision. Throughout the                            procedure, the patient's blood pressure, pulse, and                            oxygen saturations were monitored continuously. The                            Colonoscope was introduced through the anus and                            advanced to the cecum,  identified by appendiceal                            orifice and ileocecal valve. The colonoscopy was                            performed without difficulty. The patient tolerated                            the procedure well. The quality of the bowel                            preparation was excellent. The ileocecal valve,                            appendiceal orifice, and rectum were photographed. Scope In: 2:22:14 PM Scope Out: 2:38:57 PM Scope Withdrawal Time: 0 hours 11 minutes 52 seconds  Total Procedure Duration: 0 hours 16 minutes 43 seconds  Findings:                 The digital rectal exam was normal.                           A 8 mm polyp was found in the proximal descending                            colon. The polyp was sessile. The polyp was removed                            with a cold snare. Resection and retrieval were                            complete.                           A 3 mm polyp was found in the sigmoid colon. The                            polyp was sessile. The polyp was  removed with a                            cold snare. Resection and retrieval were complete.                           The exam was otherwise without abnormality on                            direct and retroflexion views. Complications:            No immediate complications. Estimated Blood Loss:     Estimated blood loss was minimal. Impression:               - One 8 mm polyp in the proximal descending colon,                            removed with a cold snare. Resected and retrieved.                           - One 3 mm polyp in the sigmoid colon, removed with                            a cold snare. Resected and retrieved.                           - The examination was otherwise normal on direct                            and retroflexion views. Recommendation:           - Patient has a contact number available for                            emergencies. The signs and symptoms  of potential                            delayed complications were discussed with the                            patient. Return to normal activities tomorrow.                            Written discharge instructions were provided to the                            patient.                           - Resume previous diet.                           - Continue present medications.                           - Await pathology results.                           -  Repeat colonoscopy is recommended. The                            colonoscopy date will be determined after pathology                            results from today's exam become available for                            review. Jerene Bears, MD 06/20/2020 2:41:20 PM This report has been signed electronically.

## 2020-06-21 LAB — TISSUE TRANSGLUTAMINASE, IGA: (tTG) Ab, IgA: 1 U/mL

## 2020-06-22 ENCOUNTER — Encounter: Payer: Self-pay | Admitting: Physician Assistant

## 2020-06-22 ENCOUNTER — Telehealth: Payer: Self-pay | Admitting: *Deleted

## 2020-06-22 NOTE — Telephone Encounter (Signed)
  Follow up Call-  Call back number 06/20/2020  Post procedure Call Back phone  # [phone]  Permission to leave phone message Yes  Some recent data might be hidden     Patient questions:  Message left to call us if necessary.

## 2020-06-22 NOTE — Telephone Encounter (Signed)
Pt called and stated she is feeling fine. No questions at this time.

## 2020-06-23 ENCOUNTER — Encounter: Payer: Self-pay | Admitting: Internal Medicine

## 2020-07-06 ENCOUNTER — Ambulatory Visit: Payer: 59 | Admitting: Podiatry

## 2020-07-06 ENCOUNTER — Other Ambulatory Visit: Payer: Self-pay

## 2020-07-06 DIAGNOSIS — L603 Nail dystrophy: Secondary | ICD-10-CM | POA: Diagnosis not present

## 2020-07-06 DIAGNOSIS — I10 Essential (primary) hypertension: Secondary | ICD-10-CM | POA: Insufficient documentation

## 2020-07-06 DIAGNOSIS — B351 Tinea unguium: Secondary | ICD-10-CM

## 2020-07-06 NOTE — Progress Notes (Signed)
    Subjective:  Patient ID: Rebecca Carroll, female    DOB: 1971-04-13,  MRN: [id number]  Chief Complaint  Patient presents with  . Nail Problem    Left 1st and 5th toenails history of injury, now grow thick and discolored.    49 y.o. female presents with the above complaint. History confirmed with patient.   Objective:  Physical Exam: warm, good capillary refill, nail exam left hallux/5th toenail thickening, yellow discoloration, transverse ridging, no trophic changes or ulcerative lesions, normal DP and PT pulses and normal sensory exam. Left Foot: normal exam, no swelling, tenderness, instability; ligaments intact, full range of motion of all ankle/foot joints  Right Foot: normal exam, no swelling, tenderness, instability; ligaments intact, full range of motion of all ankle/foot joints     Assessment:   1. Nail dystrophy   2. Onychomycosis      Plan:  Patient was evaluated and treated and all questions answered.  Nail dystrophy -Educated on etiology -Debrided and burred -Recc tolycylen gel or other urea gel. -Discussed removal as alternative -F/u PRN  Return if symptoms worsen or fail to improve.

## 2020-12-03 ENCOUNTER — Emergency Department (HOSPITAL_COMMUNITY)
Admission: EM | Admit: 2020-12-03 | Discharge: 2020-12-03 | Disposition: A | Discharge status: Home / Self Care | Payer: 59 | Attending: Emergency Medicine | Admitting: Emergency Medicine

## 2020-12-03 ENCOUNTER — Other Ambulatory Visit: Payer: Self-pay

## 2020-12-03 ENCOUNTER — Encounter (HOSPITAL_COMMUNITY): Payer: Self-pay | Admitting: Emergency Medicine

## 2020-12-03 DIAGNOSIS — Z5321 Procedure and treatment not carried out due to patient leaving prior to being seen by health care provider: Secondary | ICD-10-CM | POA: Diagnosis not present

## 2020-12-03 DIAGNOSIS — I10 Essential (primary) hypertension: Secondary | ICD-10-CM | POA: Diagnosis not present

## 2020-12-03 NOTE — ED Triage Notes (Signed)
Feels like she cannot get her BP to go down   Started sweating   Took extra metaprolol at 1530 without change in BP  Here for eval and upon the recommendation of OC person for her PCP

## 2021-02-02 ENCOUNTER — Other Ambulatory Visit: Payer: Self-pay

## 2021-02-02 DIAGNOSIS — L409 Psoriasis, unspecified: Secondary | ICD-10-CM

## 2021-02-02 MED ORDER — OTEZLA 30 MG PO TABS: 30.0000 mg | ORAL_TABLET | Freq: Two times a day (BID) | ORAL | 1 refills | Status: DC

## 2021-02-12 ENCOUNTER — Telehealth: Payer: Self-pay

## 2021-02-12 NOTE — Telephone Encounter (Signed)
Fax received from [employer] for prior authorization for the patient's Otezla.  Unable to complete request due to patient needing a follow up appointment.

## 2021-02-12 NOTE — Telephone Encounter (Signed)
Phone call to patient to get her scheduled for a follow up on her Rutherford Nail.  Patient's voicemail full, I will send patient a MyChart message to give the office a call and schedule an appointment.

## 2021-03-28 ENCOUNTER — Telehealth: Payer: Self-pay

## 2021-03-28 NOTE — Telephone Encounter (Signed)
Phone call to patient to inform her that before we can do a prior authorization for her Rutherford Nail or give her a refill on the [place] she will need to contact the office to schedule a yearly appointment. Detailed voicemail left for patient with this information.

## 2021-04-10 ENCOUNTER — Encounter: Payer: Self-pay | Admitting: Cardiology

## 2021-04-10 ENCOUNTER — Emergency Department (HOSPITAL_COMMUNITY): Payer: 59

## 2021-04-10 ENCOUNTER — Ambulatory Visit: Payer: 59 | Admitting: Cardiology

## 2021-04-10 ENCOUNTER — Other Ambulatory Visit: Payer: Self-pay

## 2021-04-10 ENCOUNTER — Emergency Department (HOSPITAL_COMMUNITY)
Admission: EM | Admit: 2021-04-10 | Discharge: 2021-04-10 | Disposition: A | Discharge status: Home / Self Care | Payer: 59 | Attending: Emergency Medicine | Admitting: Emergency Medicine

## 2021-04-10 ENCOUNTER — Encounter (HOSPITAL_COMMUNITY): Payer: Self-pay

## 2021-04-10 VITALS — BP 116/78 | HR 110 | Temp 97.3°F | Resp 16 | Ht 62.0 in | Wt 163.0 lb

## 2021-04-10 DIAGNOSIS — E039 Hypothyroidism, unspecified: Secondary | ICD-10-CM

## 2021-04-10 DIAGNOSIS — R002 Palpitations: Secondary | ICD-10-CM

## 2021-04-10 DIAGNOSIS — E1169 Type 2 diabetes mellitus with other specified complication: Secondary | ICD-10-CM | POA: Diagnosis not present

## 2021-04-10 DIAGNOSIS — R252 Cramp and spasm: Secondary | ICD-10-CM | POA: Insufficient documentation

## 2021-04-10 DIAGNOSIS — Z794 Long term (current) use of insulin: Secondary | ICD-10-CM

## 2021-04-10 DIAGNOSIS — Z79899 Other long term (current) drug therapy: Secondary | ICD-10-CM | POA: Diagnosis not present

## 2021-04-10 DIAGNOSIS — I10 Essential (primary) hypertension: Secondary | ICD-10-CM

## 2021-04-10 DIAGNOSIS — R000 Tachycardia, unspecified: Secondary | ICD-10-CM | POA: Diagnosis not present

## 2021-04-10 DIAGNOSIS — E785 Hyperlipidemia, unspecified: Secondary | ICD-10-CM | POA: Insufficient documentation

## 2021-04-10 DIAGNOSIS — R61 Generalized hyperhidrosis: Secondary | ICD-10-CM

## 2021-04-10 DIAGNOSIS — R0609 Other forms of dyspnea: Secondary | ICD-10-CM

## 2021-04-10 DIAGNOSIS — E782 Mixed hyperlipidemia: Secondary | ICD-10-CM

## 2021-04-10 DIAGNOSIS — R0602 Shortness of breath: Secondary | ICD-10-CM | POA: Insufficient documentation

## 2021-04-10 DIAGNOSIS — R739 Hyperglycemia, unspecified: Secondary | ICD-10-CM

## 2021-04-10 DIAGNOSIS — Z7982 Long term (current) use of aspirin: Secondary | ICD-10-CM | POA: Insufficient documentation

## 2021-04-10 DIAGNOSIS — E1165 Type 2 diabetes mellitus with hyperglycemia: Secondary | ICD-10-CM

## 2021-04-10 DIAGNOSIS — R7989 Other specified abnormal findings of blood chemistry: Secondary | ICD-10-CM

## 2021-04-10 DIAGNOSIS — Z79890 Hormone replacement therapy (postmenopausal): Secondary | ICD-10-CM

## 2021-04-10 DIAGNOSIS — R0600 Dyspnea, unspecified: Secondary | ICD-10-CM

## 2021-04-10 LAB — I-STAT CHEM 8, ED
BUN: 22 mg/dL — ABNORMAL HIGH (ref 6–20)
Calcium, Ion: 1.27 mmol/L (ref 1.15–1.40)
Chloride: 101 mmol/L (ref 98–111)
Creatinine, Ser: 0.5 mg/dL (ref 0.44–1.00)
Glucose, Bld: 268 mg/dL — ABNORMAL HIGH (ref 70–99)
HCT: 40 % (ref 36.0–46.0)
Hemoglobin: 13.6 g/dL (ref 12.0–15.0)
Potassium: 3.6 mmol/L (ref 3.5–5.1)
Sodium: 134 mmol/L — ABNORMAL LOW (ref 135–145)
TCO2: 21 mmol/L — ABNORMAL LOW (ref 22–32)

## 2021-04-10 LAB — BASIC METABOLIC PANEL
Anion gap: 15 (ref 5–15)
BUN: 19 mg/dL (ref 6–20)
CO2: 19 mmol/L — ABNORMAL LOW (ref 22–32)
Calcium: 10.9 mg/dL — ABNORMAL HIGH (ref 8.9–10.3)
Chloride: 98 mmol/L (ref 98–111)
Creatinine, Ser: 0.78 mg/dL (ref 0.44–1.00)
GFR, Estimated: >60 mL/min (ref >60)
Glucose, Bld: 270 mg/dL — ABNORMAL HIGH (ref 70–99)
Potassium: 3.7 mmol/L (ref 3.5–5.1)
Sodium: 132 mmol/L — ABNORMAL LOW (ref 135–145)

## 2021-04-10 LAB — CBC
HCT: 39.3 % (ref 36.0–46.0)
Hemoglobin: 13.5 g/dL (ref 12.0–15.0)
MCH: 30.3 pg (ref 26.0–34.0)
MCHC: 34.4 g/dL (ref 30.0–36.0)
MCV: 88.3 fL (ref 80.0–100.0)
Platelets: 318 10*3/uL (ref 150–400)
RBC: 4.45 MIL/uL (ref 3.87–5.11)
RDW: 12.4 % (ref 11.5–15.5)
WBC: 8.2 10*3/uL (ref 4.0–10.5)
nRBC: 0 % (ref 0.0–0.2)

## 2021-04-10 LAB — I-STAT BETA HCG BLOOD, ED (MC, WL, AP ONLY): I-stat hCG, quantitative: <5 m[IU]/mL (ref <5)

## 2021-04-10 LAB — CBG MONITORING, ED: Glucose-Capillary: 218 mg/dL — ABNORMAL HIGH (ref 70–99)

## 2021-04-10 LAB — TSH: TSH: 9.499 u[IU]/mL — ABNORMAL HIGH (ref 0.350–4.500)

## 2021-04-10 LAB — D-DIMER, QUANTITATIVE: D-Dimer, Quant: 0.37 ug/mL-FEU (ref 0.00–0.50)

## 2021-04-10 LAB — TROPONIN I (HIGH SENSITIVITY): Troponin I (High Sensitivity): 7 ng/L (ref <18)

## 2021-04-10 MED ORDER — IOHEXOL 350 MG/ML SOLN
75.0000 mL | Freq: Once | INTRAVENOUS | Status: AC | PRN
  Administered 2021-04-10: 75 mL via INTRAVENOUS

## 2021-04-10 MED ORDER — SODIUM CHLORIDE 0.9 % IV BOLUS
1000.0000 mL | Freq: Once | INTRAVENOUS | Status: AC
  Administered 2021-04-10: 1000 mL via INTRAVENOUS

## 2021-04-10 NOTE — ED Notes (Signed)
Lab called regarding only TSH being in process RN currently on hold.

## 2021-04-10 NOTE — Discharge Instructions (Signed)
Elevated heart rate is likely from diabetes.  Please stay hydrated and take your diabetes medicines as prescribed  Your thyroid function is slightly abnormal so please follow-up with your doctor regarding that   Return to ER if you have worsening palpitations, chest pain, trouble breathing, fevers, cough.

## 2021-04-10 NOTE — ED Triage Notes (Signed)
Emergency Medicine Provider Triage Evaluation Note  Rebecca Carroll , a 50 y.o. female  was evaluated in triage.  Pt complains of elevated heart rate associated with shortness of breath and chest tightness for the past few days. Patient evaluated by Dr. Terri Skains with cardiology and sent to the ED for further evaluation and to rule out possible PE due to bilateral calf tenderness and hormonal therapy. No history of blood clots, recent surgeries, or recent long immobilizations. HR up to 140s when ambulating  Review of Systems  Positive: Palpitations, chest pain, shortness of breath   Physical Exam  BP (!) 140/104 (BP Location: Right Arm)   Pulse (!) 114   Temp 98.2 F (36.8 C) (Oral)   Resp 18   Ht 5\' 2"  (1.575 m)   Wt 73.5 kg   SpO2 100%   BMI 29.63 kg/m  Gen:   Awake, no distress   HEENT:  Atraumatic  Resp:  Normal effort  Cardiac:  techycardia Abd:   Nondistended, nontender  MSK:   Bilateral calf tenderness. Neuro:  Speech clear   Medical Decision Making  Medically screening exam initiated at 6:20 PM.  Appropriate orders placed.  Rebecca Carroll was informed that the remainder of the evaluation will be completed by another provider, this initial triage assessment does not replace that evaluation, and the importance of remaining in the ED until their evaluation is complete.  Clinical Impression  Tachycardia associated with chest pain and shortness of breath. Cardiac labs ordered. D-dimer to rule out PE. TSH to rule out thyroid dysfunction.   Rebecca Carroll, [place] 04/10/21 1823

## 2021-04-10 NOTE — ED Triage Notes (Signed)
Pt sent from PCP for heart racing. Pt states her HR has been 105-150 and feels like she is having palpitations. Pt c/o bilateral calf pain. Pt has a headache as well. Symptoms have been intermittent for past month worsening over past few days.

## 2021-04-10 NOTE — ED Provider Notes (Signed)
[facility]PART[facility] Provider Note   CSN: [id number] Arrival date & time: 04/10/21  1649     History Chief Complaint  Patient presents with  . Chest Pain    Rebecca Carroll is a 50 y.o. female history diabetes, hypertension, fibromyalgia, high cholesterol, hypothyroidism here presenting with shortness of breath and palpitations.  Patient has been having palpitations for the last several weeks.  Patient states that it is worse with exertion.  She states that over the last week or so it has been getting worse.  She went to see her doctor yesterday and her blood sugar was over 500.  She was diagnosed with new onset diabetes.  She was started on insulin.  She saw cardiology today was noted to have heart rate between 110 to 150.  She was told to come to the hospital for possible PE.  Patient states that she has bilateral leg cramps.  Denies any history of PEs in the past.   The history is provided by the patient.       Past Medical History:  Diagnosis Date  . Allergy   . Anemia   . Arthritis   . Celiac disease   . Diabetes mellitus without complication ([name])   . Essential hypertension, benign   . Fibromyalgia   . GERD (gastroesophageal reflux disease)   . High cholesterol   . History of anemia   . Hypothyroidism   . Migraine   . Scalp psoriasis     Patient Active Problem List   Diagnosis Date Noted  . Hypertensive disorder 07/06/2020  . Lumbar spondylosis 02/18/2017  . Vitamin D deficiency 06/09/2015  . Obesity 06/09/2015  . Elevated transaminase level 06/09/2015  . Heart palpitations 09/07/2013  . DOE (dyspnea on exertion) 09/01/2013  . 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 Allergic rhinitis 04/07/2013  . Allergic conjunctivitis 04/07/2013  . Unspecified visual disturbance 03/26/2013  . 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 Celiac disease 02/17/2012  . GERD (gastroesophageal reflux disease) 12/26/2011  . Fibromyalgia  12/26/2011  . Essential hypertension, benign 12/26/2011  . Hyperlipidemia 12/26/2011  . Hypothyroidism 12/26/2011    Past Surgical History:  Procedure Laterality Date  . ENDOMETRIAL ABLATION    . ESOPHAGOGASTRODUODENOSCOPY    . ROTATOR CUFF REPAIR Left   . TONSILLECTOMY       OB History   No obstetric history on file.     Family History  Problem Relation Age of Onset  . Lung cancer Maternal Grandfather   . Diabetes Son   . Heart disease Maternal Grandmother   . Heart disease Paternal Grandmother   . Diverticulosis Father   . Hypertension Father   . Colon polyps Father   . Lung cancer Maternal Aunt   . Breast cancer Maternal Aunt        late 77s early 59s  . Cirrhosis Paternal Uncle   . Colon cancer Paternal Uncle   . Breast cancer Maternal Aunt        late 85s early 33s   . Stomach cancer Neg Hx   . Rectal cancer Neg Hx   . Esophageal cancer Neg Hx     Social History   Tobacco Use  . Smoking status: Never Smoker  . Smokeless tobacco: Never Used  Vaping Use  . Vaping Use: Never used  Substance Use Topics  . Alcohol use: Yes    Comment: Rarely  . Drug use: No    Home Medications Prior to Admission medications  Medication Sig Start Date End Date Taking? Authorizing Provider  ALPRAZolam Duanne Moron) 0.5 MG tablet Take 1 tablet by mouth as needed. 12/11/20   [provider]  amLODipine (NORVASC) 5 MG tablet Take 5 mg by mouth 2 (two) times daily at 8 am and 10 pm. 06/08/20   [provider]  Apremilast (OTEZLA) 30 MG TABS Take 1 tablet (30 mg total) by mouth 2 (two) times daily. 02/02/21   Warren Danes, PA-C  aspirin EC 81 MG tablet Take 81 mg by mouth daily. Swallow whole.    [provider]  B-D UF III MINI PEN NEEDLES 31G X 5 MM MISC Inject into the skin. 04/10/21   [provider]  DULoxetine (CYMBALTA) 60 MG capsule Take 1 capsule (60 mg total) by mouth daily. 06/09/15   Kathyrn Drown, MD  insulin aspart (NOVOLOG) 100  UNIT/ML injection 10 units 04/09/21   [provider]  Insulin Degludec (TRESIBA) 100 UNIT/ML SOLN 15units 04/09/21   [provider]  Insulin Pen Needle (PEN NEEDLES) 31G X 5 MM MISC Use 1 pen needle with Rx of Tresiba and Rx of Humalog as directed 04/09/21   [provider]  Lancets (ONETOUCH DELICA PLUS PPIRJJ88C) [place]  04/09/21   [provider]  levothyroxine (SYNTHROID, LEVOTHROID) 88 MCG tablet Take 100 mcg by mouth daily at 12 noon. Only on Thursday takes 161mcg    [provider]  losartan-hydrochlorothiazide (HYZAAR) 100-25 MG tablet Take 1 tablet by mouth daily.    [provider]  metoprolol succinate (TOPROL-XL) 25 MG 24 hr tablet Take 25 mg by mouth daily.    [provider]  norethindrone-ethinyl estradiol (FEMHRT 1/5) 1-5 MG-MCG TABS tablet norethindrone acetate 1 mg-ethinyl estradiol 5 mcg tablet  TAKE 1 TABLET BY MOUTH EVERY DAY    [provider]  omeprazole (PRILOSEC) 20 MG capsule Take 1 capsule (20 mg total) by mouth daily. 06/16/19   Langston Masker B, PA-C  rosuvastatin (CRESTOR) 10 MG tablet Take 10 mg by mouth daily.    [provider]    Allergies    Wheat bran  Review of Systems   Review of Systems  Respiratory: Positive for shortness of breath.   Cardiovascular: Positive for palpitations.  All other systems reviewed and are negative.   Physical Exam Updated Vital Signs BP (!) 141/99   Pulse 99   Temp 98.2 F (36.8 C) (Oral)   Resp 18   Ht 5\' 2"  (1.575 m)   Wt 73.5 kg   SpO2 96%   BMI 29.63 kg/m   Physical Exam Vitals and nursing note reviewed.  Constitutional:      Comments: Slightly dehydrated  HENT:     Head: Normocephalic.  Eyes:     Extraocular Movements: Extraocular movements intact.     Pupils: Pupils are equal, round, and reactive to light.  Cardiovascular:     Rate and Rhythm: Regular rhythm. Tachycardia present.     Heart sounds: Normal heart sounds.   Pulmonary:     Effort: Pulmonary effort is normal.     Breath sounds: Normal breath sounds.  Abdominal:     General: Bowel sounds are normal.     Palpations: Abdomen is soft.  Musculoskeletal:        General: Normal range of motion.     Cervical back: Normal range of motion and neck supple.     Comments: No calf tenderness  Skin:    General: Skin is warm.  Capillary Refill: Capillary refill takes less than 2 seconds.  Neurological:     General: No focal deficit present.     Mental Status: She is oriented to person, place, and time.  Psychiatric:        Mood and Affect: Mood normal.        Behavior: Behavior normal.     ED Results / Procedures / Treatments   Labs (all labs ordered are listed, but only abnormal results are displayed) Labs Reviewed  BASIC METABOLIC PANEL - Abnormal; Notable for the following components:      Result Value   Sodium 132 (*)    CO2 19 (*)    Glucose, Bld 270 (*)    Calcium 10.9 (*)    All other components within normal limits  TSH - Abnormal; Notable for the following components:   TSH 9.499 (*)    All other components within normal limits  I-STAT CHEM 8, ED - Abnormal; Notable for the following components:   Sodium 134 (*)    BUN 22 (*)    Glucose, Bld 268 (*)    TCO2 21 (*)    All other components within normal limits  CBG MONITORING, ED - Abnormal; Notable for the following components:   Glucose-Capillary 218 (*)    All other components within normal limits  CBC  D-DIMER, QUANTITATIVE  I-STAT BETA HCG BLOOD, ED ([facility], [facility], [facility] ONLY)  TROPONIN I (HIGH SENSITIVITY)  TROPONIN I (HIGH SENSITIVITY)    EKG EKG Interpretation  Date/Time:  Tuesday April 10 2021 18:16:05 EDT Ventricular Rate:  114 PR Interval:  126 QRS Duration: 90 QT Interval:  314 QTC Calculation: 432 R Axis:   -44 Text Interpretation: Sinus tachycardia Left axis deviation Minimal voltage criteria for LVH, may be normal variant ( Cornell product ) Anterior infarct  , age undetermined Abnormal ECG No previous ECGs available Confirmed by Wandra Arthurs ([id number]) on 04/10/2021 6:43:53 PM   Radiology DG Chest 2 View  Result Date: 04/10/2021 CLINICAL DATA:  Chest pain. EXAM: CHEST - 2 VIEW COMPARISON:  June 16, 2019. FINDINGS: The heart size and mediastinal contours are within normal limits. Both lungs are clear. No pneumothorax or pleural effusion is noted. The visualized skeletal structures are unremarkable. IMPRESSION: No active cardiopulmonary disease. Electronically Signed   By: Marijo Conception M.D.   On: 04/10/2021 18:46   CT Angio Chest PE W and/or Wo Contrast  Result Date: 04/10/2021 CLINICAL DATA:  PE suspected, high prob Chest pain EXAM: CT ANGIOGRAPHY CHEST WITH CONTRAST TECHNIQUE: Multidetector CT imaging of the chest was performed using the standard protocol during bolus administration of intravenous contrast. Multiplanar CT image reconstructions and MIPs were obtained to evaluate the vascular anatomy. CONTRAST:  38mL OMNIPAQUE IOHEXOL 350 MG/ML SOLN COMPARISON:  Radiograph earlier today. FINDINGS: Cardiovascular: Excellent opacification of the pulmonary arteries. There are no filling defects within the pulmonary arteries to suggest pulmonary embolus. The thoracic aorta is normal in caliber. No dissection or acute aortic findings mild atherosclerosis of the descending aorta. The heart is normal in size. No pericardial effusion. Mediastinum/Nodes: No mediastinal or hilar adenopathy. No suspicious thyroid nodule. Decompressed esophagus. Lungs/Pleura: No acute or focal airspace disease. No pleural fluid. No findings of pulmonary edema. Trachea and central bronchi are patent. No pulmonary nodule. Upper Abdomen: No acute findings. Suspected small cyst in the left lobe of the liver is only partially included in the field of view. Musculoskeletal: Remote right lateral rib fracture. There are no acute  or suspicious osseous abnormalities. No chest wall soft tissue  abnormality. Review of the MIP images confirms the above findings. IMPRESSION: No pulmonary embolus or acute intrathoracic abnormality. Aortic Atherosclerosis (ICD10-I70.0). Electronically Signed   By: Keith Rake M.D.   On: 04/10/2021 21:14    Procedures Procedures   Medications Ordered in ED Medications  sodium chloride 0.9 % bolus 1,000 mL (1,000 mLs Intravenous New Bag/Given 04/10/21 2003)  iohexol (OMNIPAQUE) 350 MG/ML injection 75 mL (75 mLs Intravenous Contrast Given 04/10/21 2105)    ED Course  I have reviewed the triage vital signs and the nursing notes.  Pertinent labs & imaging results that were available during my care of the patient were reviewed by me and considered in my medical decision making (see chart for details).    MDM Rules/Calculators/A&P                         Arminda Foglio is a 50 y.o. female who presented with palpitations and shortness of breath.  Patient is persistently tachycardic around 110- 140. Consider PE vs dehydration from new onset diabetes.  Will check chemistry and will get CT PE.  Will hydrate and reassess  9:57 PM Glucose is 270.  Anion gap is normal.  After given IV fluids her heart rate is normal and her CBG went down to 218.  CTA did not show any PE.  Of note her TSH is elevated at 9.5.  However hypothyroidism usually cause bradycardia not tachycardia.  She can follow-up with PCP and I think her tachycardia is likely from new onset diabetes.  We will have her follow with PCP and told her to take her meds as prescribed.    Final Clinical Impression(s) / ED Diagnoses Final diagnoses:  None    Rx / DC Orders ED Discharge Orders    None       Drenda Freeze, MD 04/10/21 2159

## 2021-04-10 NOTE — ED Notes (Signed)
Medications follow up appts reviewed w/ pt. Denies questions or concerns @ this time. Education on s/s of worsening and when to return. Evaluated for tachycardia. PE ruled out. HR down to 94 after 1L fluids, Left w/ even and steady gait. NAD noted. PIV removed and VSS.

## 2021-04-10 NOTE — ED Notes (Signed)
Patient not in room at this time.

## 2021-04-10 NOTE — ED Notes (Signed)
Assuming care. Pt not in room.

## 2021-04-10 NOTE — Progress Notes (Addendum)
Date:  04/10/2021   ID:  Jearld Lesch, DOB 1971-06-17, MRN [id number]  PCP:  Shon Baton, MD  Cardiologist:  Rex Kras, DO, [facility] (established care 04/10/2021) Former Cardiology Providers: NA  REASON FOR CONSULT: Tachycardia and dyspnea on exertion  REQUESTING PHYSICIAN:  Shon Baton, MD [address] [place],  [place] [address]  Chief Complaint  Patient presents with  . Tachycardia  . Dyspnea on exertion  . New Patient (Initial Visit)    HPI  Rebecca Carroll is a 50 y.o. female who presents to the office with a chief complaint of " shortness of breath and elevated heart rate." Patient's past medical history and cardiovascular risk factors include: New onset of diabetes mellitus , hypertension, hyperlipidemia, hypothyroidism, vitamin D deficiency, currently going through menopause on hormone replacement therapy.  She is referred to the office at the request of Shon Baton, MD for evaluation of tachycardia and dyspnea on exertion.  Patient states that for the last 3 or 4 days she has been experiencing resting tachycardia at 103-115 bpm and with ambulation up to 130-140 beats a minute.  This is associated with effort related dyspnea and also bilateral leg cramps.  She denies any recent hospitalizations, no prolonged immobilization, no prolonged car rides or plane rides, no recent surgeries.  She is on statin therapy and has not had leg cramps in the past.  She is on hormone replacement therapy as well.  Yesterday she when she went to her primary care she was diagnosed with diabetes mellitus with serum glucose greater than 500 and a hemoglobin A1c of 10.9.  She has been started on medical therapy.  In addition, she has been having substernal/left-sided chest pain feels like a burning-like sensation but not associated with effort late activities and does not resolve with rest.  It usually self-limited.  During our encounter patient was sweaty and clammy and was wiping her face several times as  I was obtaining the HPI.  No family history of premature coronary disease or sudden cardiac death.  FUNCTIONAL STATUS: No structured exercise program or daily routine.   ALLERGIES: Allergies  Allergen Reactions  . Wheat Bran Nausea And Vomiting    MEDICATION LIST PRIOR TO VISIT: Current Meds  Medication Sig  . ALPRAZolam (XANAX) 0.5 MG tablet Take 1 tablet by mouth as needed.  Marland Kitchen amLODipine (NORVASC) 5 MG tablet Take 5 mg by mouth 2 (two) times daily at 8 am and 10 pm.  . Apremilast (OTEZLA) 30 MG TABS Take 1 tablet (30 mg total) by mouth 2 (two) times daily.  Marland Kitchen aspirin EC 81 MG tablet Take 81 mg by mouth daily. Swallow whole.  . B-D UF III MINI PEN NEEDLES 31G X 5 MM MISC Inject into the skin.  . DULoxetine (CYMBALTA) 60 MG capsule Take 1 capsule (60 mg total) by mouth daily.  . insulin aspart (NOVOLOG) 100 UNIT/ML injection 10 units  . Insulin Degludec (TRESIBA) 100 UNIT/ML SOLN 15units  . Insulin Pen Needle (PEN NEEDLES) 31G X 5 MM MISC Use 1 pen needle with Rx of Tresiba and Rx of Humalog as directed  . Lancets (ONETOUCH DELICA PLUS [id number]) MISC   . levothyroxine (SYNTHROID, LEVOTHROID) 88 MCG tablet Take 100 mcg by mouth daily at 12 noon. Only on Thursday takes 110mg  . losartan-hydrochlorothiazide (HYZAAR) 100-25 MG tablet Take 1 tablet by mouth daily.  . metoprolol succinate (TOPROL-XL) 25 MG 24 hr tablet Take 25 mg by mouth daily.  . norethindrone-ethinyl estradiol (FEMHRT 1/5) 1-5 MG-MCG  TABS tablet norethindrone acetate 1 mg-ethinyl estradiol 5 mcg tablet  TAKE 1 TABLET BY MOUTH EVERY DAY  . omeprazole (PRILOSEC) 20 MG capsule Take 1 capsule (20 mg total) by mouth daily.  . rosuvastatin (CRESTOR) 10 MG tablet Take 10 mg by mouth daily.     PAST MEDICAL HISTORY: Past Medical History:  Diagnosis Date  . Allergy   . Anemia   . Arthritis   . Celiac disease   . Diabetes mellitus without complication ([place])   . Essential hypertension, benign   . Fibromyalgia   .  GERD (gastroesophageal reflux disease)   . High cholesterol   . History of anemia   . Hypothyroidism   . Migraine   . Scalp psoriasis     PAST SURGICAL HISTORY: Past Surgical History:  Procedure Laterality Date  . ENDOMETRIAL ABLATION    . ESOPHAGOGASTRODUODENOSCOPY    . ROTATOR CUFF REPAIR Left   . TONSILLECTOMY      FAMILY HISTORY: The patient family history includes Breast cancer in her maternal aunt and maternal aunt; Cirrhosis in her paternal uncle; Colon cancer in her paternal uncle; Colon polyps in her father; Diabetes in her son; Diverticulosis in her father; Heart disease in her maternal grandmother and paternal grandmother; Hypertension in her father; Lung cancer in her maternal aunt and maternal grandfather.  SOCIAL HISTORY:  The patient  reports that she has never smoked. She has never used smokeless tobacco. She reports current alcohol use. She reports that she does not use drugs.  REVIEW OF SYSTEMS: Review of Systems  Constitutional: Positive for diaphoresis. Negative for chills and fever.  HENT: Negative for hoarse voice and nosebleeds.   Eyes: Negative for discharge, double vision and pain.  Cardiovascular: Positive for dyspnea on exertion and palpitations. Negative for chest pain, claudication, leg swelling, near-syncope, orthopnea, paroxysmal nocturnal dyspnea and syncope.  Respiratory: Positive for shortness of breath. Negative for hemoptysis.   Endocrine: Positive for heat intolerance, polydipsia and polyuria. Negative for cold intolerance.  Musculoskeletal: Positive for muscle cramps (bilateral calf pain). Negative for myalgias.  Gastrointestinal: Positive for heartburn. Negative for abdominal pain, constipation, diarrhea, hematemesis, hematochezia, melena, nausea and vomiting.  Neurological: Positive for dizziness. Negative for light-headedness.    PHYSICAL EXAM: Vitals with BMI 04/10/2021 04/10/2021 12/03/2020  Height - '5\' 2"'  '5\' 2"'   Weight - 163 lbs 168 lbs   BMI - 69.79 48.01  Systolic 655 374 827  Diastolic 078 78 675  Pulse 114 110 108  Some encounter information is confidential and restricted. Go to Review Flowsheets activity to see all data.    CONSTITUTIONAL: Well-developed and well-nourished. No acute distress.  SKIN: Skin is warm and dry. No rash noted. No cyanosis. No pallor. No jaundice HEAD: Normocephalic and atraumatic.  EYES: No scleral icterus MOUTH/THROAT: Moist oral membranes.  NECK: No JVD present. No thyromegaly noted. No carotid bruits  LYMPHATIC: No visible cervical adenopathy.  CHEST Normal respiratory effort. No intercostal retractions  LUNGS: Clear to auscultation bilaterally.  No stridor. No wheezes. No rales.  CARDIOVASCULAR: Tachycardic, positive S1-S2, no murmurs rubs or gallops appreciated secondary to tachycardia. ABDOMINAL: Soft, nontender, nondistended, positive bowel sounds in all 4 quadrants no apparent ascites.  EXTREMITIES: No peripheral edema.  2+ bilateral posterior tibial pulses. HEMATOLOGIC: No significant bruising NEUROLOGIC: Oriented to person, place, and time. Nonfocal. Normal muscle tone.  PSYCHIATRIC: Normal mood and affect. Normal behavior. Cooperative  CARDIAC DATABASE: EKG: 04/10/2021: Normal sinus rhythm, 99 bpm, left axis deviation, without underlying ischemia or injury  pattern.  Echocardiogram: 01/15/2019: Left ventricle: The cavity size was normal. Wall thickness was increased in a pattern of moderate LVH. Systolic function was normal. The estimated ejection fraction was 55%. Wall motion was normal; there were no regional wall motion abnormalities. Doppler parameters are consistent with abnormal left ventricular relaxation (grade 1 diastolic dysfunction). Left atrium: The atrium was mildly dilated.    Stress Testing: No results found for this or any previous visit from the past 1095 days.  Heart Catheterization: None  LABORATORY DATA: CBC Latest Ref Rng & Units 03/30/2020 06/16/2019  01/09/2019  WBC 3.8 - 10.8 Thousand/uL 6.7 8.1 6.2  Hemoglobin 11.7 - 15.5 g/dL 12.1 11.5(L) 12.4  Hematocrit 35.0 - 45.0 % 36.5 34.2(L) 38.0  Platelets 140 - 400 Thousand/uL 253 284 260    CMP Latest Ref Rng & Units 03/30/2020 06/16/2019 01/09/2019  Glucose 65 - 99 mg/dL 200(H) 78 110(H)  BUN 7 - 25 mg/dL '13 8 11  ' Creatinine 0.50 - 1.05 mg/dL 0.81 0.67 0.60  Sodium 135 - 146 mmol/L 140 140 140  Potassium 3.5 - 5.3 mmol/L 3.4(L) 3.3(L) 3.2(L)  Chloride 98 - 110 mmol/L 97(L) 99 105  CO2 20 - 32 mmol/L 32 30 25  Calcium 8.6 - 10.4 mg/dL 9.4 9.8 10.3  Total Protein 6.1 - 8.1 g/dL 7.5 - -  Total Bilirubin 0.2 - 1.2 mg/dL 0.4 - -  Alkaline Phos 39 - 117 IU/L - - -  AST 10 - 35 U/L 26 - -  ALT 6 - 29 U/L 25 - -    Lipid Panel  Lab Results  Component Value Date   CHOL 252 (H) 05/18/2015   HDL 54 05/18/2015   LDLCALC 155 (H) 05/18/2015   TRIG 217 (H) 05/18/2015   CHOLHDL 4.7 (H) 05/18/2015    No components found for: NTPROBNP No results for input(s): PROBNP in the last 8760 hours. No results for input(s): TSH in the last 8760 hours.  BMP No results for input(s): NA, K, CL, CO2, GLUCOSE, BUN, CREATININE, CALCIUM, GFRNONAA, GFRAA in the last 8760 hours.  HEMOGLOBIN A1C Lab Results  Component Value Date   HGBA1C 5.6 05/31/2013   External Labs: Collected: 04/09/2021 provided by PCP. Creatinine 1.1 mg/dL. eGFR: 52.2 mL/min per 1.73 m Hemoglobin A1c: 10.9 Glucose 548, sodium 125, potassium 4.1, chloride 93, bicarb 18,  AST 119, ALT 83, alkaline phosphatase 79  IMPRESSION:    ICD-10-CM   1. Tachycardia  R00.0 EKG 12-Lead  2. Dyspnea on exertion  R06.00   3. Diaphoresis  R61   4. Palpitations  R00.2   5. Bilateral leg cramps  R25.2   6. Type 2 diabetes mellitus with hyperglycemia, with long-term current use of insulin (HCC)  E11.65    Z79.4   7. Long-term insulin use (HCC)  Z79.4   8. Benign hypertension  I10   9. Mixed hyperlipidemia  E78.2   10. Hypothyroidism,  unspecified type  E03.9   11. Hormone replacement therapy  Z79.890      RECOMMENDATIONS: Rebecca Carroll is a 50 y.o. female whose past medical history and cardiac risk factors include: New onset of diabetes mellitus , hypertension, hyperlipidemia, hypothyroidism, vitamin D deficiency, currently going through menopause on hormone replacement therapy.  Patient presents with symptoms of tachycardia, dyspnea on exertion, and palpitations going on for the last weekend.  Despite being on beta-blocker therapy she remains tachycardic.  Patient states at home her resting heart rate ranges between 103-115 bpm and with ambulation up to 130-140  beats a minute.  Patient is also on hormone replacement therapy and has bilateral leg pain in the calves.  Given her symptoms and risk factors would like her to be ruled out for pulmonary embolism.  I informed her that the possibility of massive PE is low as she is not hypotensive.  However submassive PE cannot be ruled out.  We discussed having imaging done at [facility] versus going to the hospital.  Patient states that her symptoms are progressively worse and she is concerned and prefers to go to the hospital for more comprehensive evaluation.  I requested EMS transfer but patient states that she will drive to the ER since it is close by.  Patient was diagnosed with diabetes mellitus type 2.  With serum glucose of greater than 500 and hemoglobin A1c of 10.9.  The differential diagnosis of underlying tachycardia and shortness of breath may be due to intravascular depletion as she is experiencing polyuria polydipsia as well.  Dyspnea on exertion: Recommend an ischemic evaluation once her tachycardia is better controlled.  Ideally she would be great candidate for coronary CTA versus stress testing.  We will discuss at the next office visit.  New diagnosed insulin-dependent diabetes mellitus type 2: Most recent hemoglobin A1c 10.9 with a serum glucose of 548.  Recently  started on medical therapy by her PCP.  Has an upcoming office visit next week.  Benign essential hypertension: Recommend a systolic blood pressure goal of less than 130 mmHg given her diabetes.  Low-salt diet recommended.  Medications reconciled.  Mixed hyperlipidemia: Do not have the most recent lipid profile for review.  However currently on Crestor.  Needs close observation as her AST and ALT are elevated based on the most recent lipid profile.  Patient was referred to the office of a stat referral for dyspnea and tachycardia.  My concern is that she may have underlying pulmonary embolism that has not been diagnosed.  We discussed having testing done at [facility] versus going to the ER and patient prefers to go to the ER for more comprehensive evaluation.  As a part of this consultation reviewed outside records provided by PCP, outside EKGs, labs, and findings noted above for further reference.  I also called the ER and spoke to the charge nurse and they are expecting the patient.  FINAL MEDICATION LIST END OF ENCOUNTER: No orders of the defined types were placed in this encounter.   Medications Discontinued During This Encounter  Medication Reason  . cholecalciferol (VITAMIN D) 1000 UNITS tablet Error  . clobetasol (TEMOVATE) 0.05 % external solution Error  . meloxicam (MOBIC) 15 MG tablet Error  . estradiol-norethindrone (ACTIVELLA) 1-0.5 MG tablet Error     Current Outpatient Medications:  .  ALPRAZolam (XANAX) 0.5 MG tablet, Take 1 tablet by mouth as needed., Disp: , Rfl:  .  amLODipine (NORVASC) 5 MG tablet, Take 5 mg by mouth 2 (two) times daily at 8 am and 10 pm., Disp: , Rfl:  .  Apremilast (OTEZLA) 30 MG TABS, Take 1 tablet (30 mg total) by mouth 2 (two) times daily., Disp: 60 tablet, Rfl: 1 .  aspirin EC 81 MG tablet, Take 81 mg by mouth daily. Swallow whole., Disp: , Rfl:  .  B-D UF III MINI PEN NEEDLES 31G X 5 MM MISC, Inject into the skin., Disp: , Rfl:  .   DULoxetine (CYMBALTA) 60 MG capsule, Take 1 capsule (60 mg total) by mouth daily., Disp: 90 capsule, Rfl: 2 .  insulin aspart (NOVOLOG) 100 UNIT/ML injection, 10 units, Disp: , Rfl:  .  Insulin Degludec (TRESIBA) 100 UNIT/ML SOLN, 15units, Disp: , Rfl:  .  Insulin Pen Needle (PEN NEEDLES) 31G X 5 MM MISC, Use 1 pen needle with Rx of Tresiba and Rx of Humalog as directed, Disp: , Rfl:  .  Lancets (ONETOUCH DELICA PLUS [id number]) MISC, , Disp: , Rfl:  .  levothyroxine (SYNTHROID, LEVOTHROID) 88 MCG tablet, Take 100 mcg by mouth daily at 12 noon. Only on Thursday takes 181mg, Disp: , Rfl:  .  losartan-hydrochlorothiazide (HYZAAR) 100-25 MG tablet, Take 1 tablet by mouth daily., Disp: , Rfl:  .  metoprolol succinate (TOPROL-XL) 25 MG 24 hr tablet, Take 25 mg by mouth daily., Disp: , Rfl:  .  norethindrone-ethinyl estradiol (FEMHRT 1/5) 1-5 MG-MCG TABS tablet, norethindrone acetate 1 mg-ethinyl estradiol 5 mcg tablet  TAKE 1 TABLET BY MOUTH EVERY DAY, Disp: , Rfl:  .  omeprazole (PRILOSEC) 20 MG capsule, Take 1 capsule (20 mg total) by mouth daily., Disp: 30 capsule, Rfl: 0 .  rosuvastatin (CRESTOR) 10 MG tablet, Take 10 mg by mouth daily., Disp: , Rfl:   Orders Placed This Encounter  Procedures  . EKG 12-Lead    There are no Patient Instructions on file for this visit.   --Continue cardiac medications as reconciled in final medication list. --Return in about 2 weeks (around 04/24/2021) for Follow up, Dyspnea. Or sooner if needed. --Continue follow-up with your primary care physician regarding the management of your other chronic comorbid conditions.  Patient's questions and concerns were addressed to her satisfaction. She voices understanding of the instructions provided during this encounter.   This note was created using a voice recognition software as a result there may be grammatical errors inadvertently enclosed that do not reflect the nature of this encounter. Every attempt is made to  correct such errors.  SRex Kras DN[place] [facility] Pager: [phone]: [phone]

## 2021-04-16 ENCOUNTER — Telehealth: Payer: Self-pay | Admitting: Cardiology

## 2021-04-16 NOTE — Telephone Encounter (Signed)
Called the patient to see how she is doing post recent office visit and ER visit.   No answer.   Let a message to call back.   Rex Kras, [place], [facility]  Pager: [phone] Office: [phone]

## 2021-04-17 ENCOUNTER — Other Ambulatory Visit (HOSPITAL_COMMUNITY): Payer: Self-pay | Admitting: Internal Medicine

## 2021-04-17 DIAGNOSIS — R1011 Right upper quadrant pain: Secondary | ICD-10-CM

## 2021-04-17 DIAGNOSIS — R110 Nausea: Secondary | ICD-10-CM

## 2021-04-18 ENCOUNTER — Other Ambulatory Visit: Payer: Self-pay

## 2021-04-18 ENCOUNTER — Ambulatory Visit (HOSPITAL_COMMUNITY)
Admission: RE | Admit: 2021-04-18 | Discharge: 2021-04-18 | Disposition: A | Discharge status: Home / Self Care | Payer: 59 | Source: Ambulatory Visit | Attending: Internal Medicine | Admitting: Internal Medicine

## 2021-04-18 DIAGNOSIS — R1011 Right upper quadrant pain: Secondary | ICD-10-CM

## 2021-04-18 DIAGNOSIS — R110 Nausea: Secondary | ICD-10-CM | POA: Diagnosis present

## 2021-04-23 NOTE — Progress Notes (Signed)
Date:  04/25/2021   ID:  Rebecca Carroll, DOB 04/17/1971, MRN [id number]  PCP:  Shon Baton, MD  Cardiologist:  Rex Kras, DO, [facility] (established care 04/10/2021) Former Cardiology Providers: NA  Date: 04/25/21 Last Office Visit: 04/10/2021  Chief Complaint  Patient presents with  . Follow-up  . Shortness of Breath    HPI  Rebecca Carroll is a 50 y.o. female who presents to the office with a chief complaint of " follow-up for shortness of breath and tachycardia." Patient's past medical history and cardiovascular risk factors include: New onset of diabetes mellitus , hypertension, hyperlipidemia, hypothyroidism, vitamin D deficiency, currently going through menopause on hormone replacement therapy.  She is referred to the office at the request of Shon Baton, MD for evaluation of tachycardia and dyspnea on exertion.  At the last office visit patient's symptoms and physical examination findings were concerning for possible pulmonary embolism and therefore was referred to ED for further evaluation and management.  She had a CT PE protocol which was negative for pulmonary embolism.  Since then patient has been continuing her medical therapy and her sugars have improved and so have her symptoms.  She is here for follow-up.  Patient states that she is no longer having tachycardia, intolerance to heat, palpitations, little bilateral leg cramps, excessive dyspnea.  She continues to have some shortness of breath with effort related activities mostly with over exertional activities.  Due to her elevated LFTs patient underwent an ultrasound of the abdomen which notes diffuse hepatic steatosis.  Patient denies chest pain or anginal equivalent.  FUNCTIONAL STATUS: No structured exercise program or daily routine.   ALLERGIES: Allergies  Allergen Reactions  . Wheat Bran Nausea And Vomiting    MEDICATION LIST PRIOR TO VISIT: Current Meds  Medication Sig  . ALPRAZolam (XANAX) 0.5 MG tablet Take 1  tablet by mouth as needed.  Marland Kitchen amLODipine (NORVASC) 5 MG tablet Take 5 mg by mouth 2 (two) times daily at 8 am and 10 pm.  . Apremilast (OTEZLA) 30 MG TABS Take 1 tablet (30 mg total) by mouth 2 (two) times daily.  Marland Kitchen aspirin EC 81 MG tablet Take 81 mg by mouth daily. Swallow whole.  . B-D UF III MINI PEN NEEDLES 31G X 5 MM MISC Inject into the skin.  . DULoxetine (CYMBALTA) 60 MG capsule Take 1 capsule (60 mg total) by mouth daily.  . insulin aspart (NOVOLOG) 100 UNIT/ML injection 10 units  . Insulin Degludec (TRESIBA) 100 UNIT/ML SOLN 15units  . Insulin Pen Needle (PEN NEEDLES) 31G X 5 MM MISC Use 1 pen needle with Rx of Tresiba and Rx of Humalog as directed  . Lancets (ONETOUCH DELICA PLUS [id number]) MISC   . levothyroxine (SYNTHROID, LEVOTHROID) 88 MCG tablet Take 100 mcg by mouth daily at 12 noon. Only on Thursday takes 145mg  . losartan-hydrochlorothiazide (HYZAAR) 100-25 MG tablet Take 1 tablet by mouth daily.  . metoprolol succinate (TOPROL-XL) 25 MG 24 hr tablet Take 25 mg by mouth daily.  . norethindrone-ethinyl estradiol (FEMHRT 1/5) 1-5 MG-MCG TABS tablet norethindrone acetate 1 mg-ethinyl estradiol 5 mcg tablet  TAKE 1 TABLET BY MOUTH EVERY DAY  . omeprazole (PRILOSEC) 20 MG capsule Take 1 capsule (20 mg total) by mouth daily.  . rosuvastatin (CRESTOR) 10 MG tablet Take 10 mg by mouth daily.     PAST MEDICAL HISTORY: Past Medical History:  Diagnosis Date  . Allergy   . Anemia   . Arthritis   . Celiac disease   .  Diabetes mellitus without complication ([place])   . Essential hypertension, benign   . Fibromyalgia   . GERD (gastroesophageal reflux disease)   . High cholesterol   . History of anemia   . Hypothyroidism   . Migraine   . Scalp psoriasis     PAST SURGICAL HISTORY: Past Surgical History:  Procedure Laterality Date  . ENDOMETRIAL ABLATION    . ESOPHAGOGASTRODUODENOSCOPY    . ROTATOR CUFF REPAIR Left   . TONSILLECTOMY      FAMILY HISTORY: The patient  family history includes Breast cancer in her maternal aunt and maternal aunt; Cirrhosis in her paternal uncle; Colon cancer in her paternal uncle; Colon polyps in her father; Diabetes in her son; Diverticulosis in her father; Heart disease in her maternal grandmother and paternal grandmother; Hypertension in her father; Lung cancer in her maternal aunt and maternal grandfather.  SOCIAL HISTORY:  The patient  reports that she has never smoked. She has never used smokeless tobacco. She reports current alcohol use. She reports that she does not use drugs.  REVIEW OF SYSTEMS: Review of Systems  Constitutional: Negative for chills, diaphoresis and fever.  HENT: Negative for hoarse voice and nosebleeds.   Eyes: Negative for discharge, double vision and pain.  Cardiovascular: Positive for dyspnea on exertion. Negative for chest pain, claudication, leg swelling, near-syncope, orthopnea, palpitations, paroxysmal nocturnal dyspnea and syncope.  Respiratory: Negative for hemoptysis and shortness of breath.   Endocrine: Negative for cold intolerance, heat intolerance, polydipsia and polyuria.  Musculoskeletal: Positive for muscle cramps (improved). Negative for myalgias.  Gastrointestinal: Negative for abdominal pain, constipation, diarrhea, heartburn, hematemesis, hematochezia, melena, nausea and vomiting.  Neurological: Negative for dizziness and light-headedness.    PHYSICAL EXAM: Vitals with BMI 04/25/2021 04/10/2021 04/10/2021  Height 5' 2" - -  Weight 164 lbs 6 oz - -  BMI 50.38 - -  Systolic 882 800 349  Diastolic 75 99 99  Pulse 80 96 99  Some encounter information is confidential and restricted. Go to Review Flowsheets activity to see all data.    CONSTITUTIONAL: Well-developed and well-nourished. No acute distress.  SKIN: Skin is warm and dry. No rash noted. No cyanosis. No pallor. No jaundice HEAD: Normocephalic and atraumatic.  EYES: No scleral icterus MOUTH/THROAT: Moist oral  membranes.  NECK: No JVD present. No thyromegaly noted. No carotid bruits  LYMPHATIC: No visible cervical adenopathy.  CHEST Normal respiratory effort. No intercostal retractions  LUNGS: Clear to auscultation bilaterally.  No stridor. No wheezes. No rales.  CARDIOVASCULAR: Positive S1-S2, no murmurs rubs or gallops appreciated. ABDOMINAL: Soft, nontender, nondistended, positive bowel sounds in all 4 quadrants no apparent ascites.  EXTREMITIES: No peripheral edema.  trace bilateral posterior tibial pulses. HEMATOLOGIC: No significant bruising NEUROLOGIC: Oriented to person, place, and time. Nonfocal. Normal muscle tone.  PSYCHIATRIC: Normal mood and affect. Normal behavior. Cooperative  RADIOLOGY:  CTA CHEST PE PROTOCOL:  04/10/2021 No pulmonary embolus or acute intrathoracic abnormality. Aortic Atherosclerosis (ICD10-I70.0).  CARDIAC DATABASE: EKG: 04/10/2021: Normal sinus rhythm, 99 bpm, left axis deviation, without underlying ischemia or injury pattern.  Echocardiogram: 01/15/2019: Left ventricle: The cavity size was normal. Wall thickness was increased in a pattern of moderate LVH. Systolic function was normal. The estimated ejection fraction was 55%. Wall motion was normal; there were no regional wall motion abnormalities. Doppler parameters are consistent with abnormal left ventricular relaxation (grade 1 diastolic dysfunction). Left atrium: The atrium was mildly dilated.    Stress Testing: No results found for this or any previous  visit from the past 1095 days.  Heart Catheterization: None  LABORATORY DATA: CBC Latest Ref Rng & Units 04/10/2021 04/10/2021 03/30/2020  WBC 4.0 - 10.5 K/uL - 8.2 6.7  Hemoglobin 12.0 - 15.0 g/dL 13.6 13.5 12.1  Hematocrit 36.0 - 46.0 % 40.0 39.3 36.5  Platelets 150 - 400 K/uL - 318 253    CMP Latest Ref Rng & Units 04/10/2021 04/10/2021 03/30/2020  Glucose 70 - 99 mg/dL 268(H) 270(H) 200(H)  BUN 6 - 20 mg/dL 22(H) 19 13  Creatinine 0.44 - 1.00  mg/dL 0.50 0.78 0.81  Sodium 135 - 145 mmol/L 134(L) 132(L) 140  Potassium 3.5 - 5.1 mmol/L 3.6 3.7 3.4(L)  Chloride 98 - 111 mmol/L 101 98 97(L)  CO2 22 - 32 mmol/L - 19(L) 32  Calcium 8.9 - 10.3 mg/dL - 10.9(H) 9.4  Total Protein 6.1 - 8.1 g/dL - - 7.5  Total Bilirubin 0.2 - 1.2 mg/dL - - 0.4  Alkaline Phos 39 - 117 IU/L - - -  AST 10 - 35 U/L - - 26  ALT 6 - 29 U/L - - 25    Lipid Panel  Lab Results  Component Value Date   CHOL 252 (H) 05/18/2015   HDL 54 05/18/2015   LDLCALC 155 (H) 05/18/2015   TRIG 217 (H) 05/18/2015   CHOLHDL 4.7 (H) 05/18/2015    No components found for: NTPROBNP No results for input(s): PROBNP in the last 8760 hours. Recent Labs    04/10/21 1827  TSH 9.499*    BMP Recent Labs    04/10/21 1917 04/10/21 2001  NA 132* 134*  K 3.7 3.6  CL 98 101  CO2 19*  --   GLUCOSE 270* 268*  BUN 19 22*  CREATININE 0.78 0.50  CALCIUM 10.9*  --   GFRNONAA >60  --     HEMOGLOBIN A1C Lab Results  Component Value Date   HGBA1C 5.6 05/31/2013   External Labs: 08/09/2020: Total cholesterol 137, triglycerides 177, HDL 56, LDL 46, non-HDL 81.  Collected: 04/09/2021 provided by PCP. Creatinine 1.1 mg/dL. eGFR: 52.2 mL/min per 1.73 m Hemoglobin A1c: 10.9 Glucose 548, sodium 125, potassium 4.1, chloride 93, bicarb 18,  AST 119, ALT 83, alkaline phosphatase 79  IMPRESSION:    ICD-10-CM   1. Dyspnea on exertion  R06.00 PCV MYOCARDIAL PERFUSION WO LEXISCAN    PCV ECHOCARDIOGRAM COMPLETE    SARS-COV-2 RNA,(COVID-19) QUAL NAAT  2. Type 2 diabetes mellitus with hyperglycemia, with long-term current use of insulin (HCC)  E11.65    Z79.4   3. Long-term insulin use (HCC)  Z79.4   4. Aortic atherosclerosis (HCC)  I70.0   5. Benign hypertension  I10   6. Mixed hyperlipidemia  E78.2   7. Hypothyroidism, unspecified type  E03.9   8. Hormone replacement therapy  Z79.890      RECOMMENDATIONS: Cydnee Fuquay is a 50 y.o. female whose past medical history and  cardiac risk factors include: New onset of diabetes mellitus , hypertension, hyperlipidemia, hypothyroidism, vitamin D deficiency, currently going through menopause on hormone replacement therapy.  Dyspnea on exertion:   Improved compared to last office visit.  Recommend an ischemic evaluation to rule out obstructive CAD.  Echocardiogram will be ordered to evaluate for structural heart disease and left ventricular systolic function.  Plan exercise nuclear stress test to evaluate for functional status and reversible ischemia.  New diagnosed insulin-dependent diabetes mellitus type 2: Most recent hemoglobin A1c 10.9 with a serum glucose of 548.  Recently started  on medical therapy by her PCP.  Currently managed by primary care provider.  Benign essential hypertension: Improving . Office blood pressure is at goal.  . Medication reconciled.  . Low salt diet recommended. A diet that is rich in fruits, vegetables, legumes, and low-fat dairy products and low in snacks, sweets, and meats (such as the Dietary Approaches to Stop Hypertension [DASH] diet).   Mixed hyperlipidemia:   Currently on rosuvastatin.  Last lipid profile reviewed via Care Everywhere.  Currently managed by primary care provider.  Does not endorse any significant myalgias   FINAL MEDICATION LIST END OF ENCOUNTER: No orders of the defined types were placed in this encounter.   There are no discontinued medications.   Current Outpatient Medications:  .  ALPRAZolam (XANAX) 0.5 MG tablet, Take 1 tablet by mouth as needed., Disp: , Rfl:  .  amLODipine (NORVASC) 5 MG tablet, Take 5 mg by mouth 2 (two) times daily at 8 am and 10 pm., Disp: , Rfl:  .  Apremilast (OTEZLA) 30 MG TABS, Take 1 tablet (30 mg total) by mouth 2 (two) times daily., Disp: 60 tablet, Rfl: 1 .  aspirin EC 81 MG tablet, Take 81 mg by mouth daily. Swallow whole., Disp: , Rfl:  .  B-D UF III MINI PEN NEEDLES 31G X 5 MM MISC, Inject into the skin., Disp: ,  Rfl:  .  DULoxetine (CYMBALTA) 60 MG capsule, Take 1 capsule (60 mg total) by mouth daily., Disp: 90 capsule, Rfl: 2 .  insulin aspart (NOVOLOG) 100 UNIT/ML injection, 10 units, Disp: , Rfl:  .  Insulin Degludec (TRESIBA) 100 UNIT/ML SOLN, 15units, Disp: , Rfl:  .  Insulin Pen Needle (PEN NEEDLES) 31G X 5 MM MISC, Use 1 pen needle with Rx of Tresiba and Rx of Humalog as directed, Disp: , Rfl:  .  Lancets (ONETOUCH DELICA PLUS [id number]) MISC, , Disp: , Rfl:  .  levothyroxine (SYNTHROID, LEVOTHROID) 88 MCG tablet, Take 100 mcg by mouth daily at 12 noon. Only on Thursday takes 181mg, Disp: , Rfl:  .  losartan-hydrochlorothiazide (HYZAAR) 100-25 MG tablet, Take 1 tablet by mouth daily., Disp: , Rfl:  .  metoprolol succinate (TOPROL-XL) 25 MG 24 hr tablet, Take 25 mg by mouth daily., Disp: , Rfl:  .  norethindrone-ethinyl estradiol (FEMHRT 1/5) 1-5 MG-MCG TABS tablet, norethindrone acetate 1 mg-ethinyl estradiol 5 mcg tablet  TAKE 1 TABLET BY MOUTH EVERY DAY, Disp: , Rfl:  .  omeprazole (PRILOSEC) 20 MG capsule, Take 1 capsule (20 mg total) by mouth daily., Disp: 30 capsule, Rfl: 0 .  rosuvastatin (CRESTOR) 10 MG tablet, Take 10 mg by mouth daily., Disp: , Rfl:  .  potassium chloride SA (KLOR-CON) 20 MEQ tablet, Take 20 mEq by mouth daily., Disp: , Rfl:   Orders Placed This Encounter  Procedures  . SARS-COV-2 RNA,(COVID-19) QUAL NAAT  . PCV MYOCARDIAL PERFUSION WO LEXISCAN  . PCV ECHOCARDIOGRAM COMPLETE    There are no Patient Instructions on file for this visit.   --Continue cardiac medications as reconciled in final medication list. --Return in about 4 weeks (around 05/23/2021) for Follow up, Dyspnea, Review test results. Or sooner if needed. --Continue follow-up with your primary care physician regarding the management of your other chronic comorbid conditions.  Patient's questions and concerns were addressed to her satisfaction. She voices understanding of the instructions provided during  this encounter.   This note was created using a voice recognition software as a result there may be grammatical  errors inadvertently enclosed that do not reflect the nature of this encounter. Every attempt is made to correct such errors.  Rex Kras, [place], [facility]  Pager: [phone] Office: [phone]

## 2021-04-25 ENCOUNTER — Ambulatory Visit: Payer: 59 | Admitting: Cardiology

## 2021-04-25 ENCOUNTER — Other Ambulatory Visit: Payer: Self-pay

## 2021-04-25 ENCOUNTER — Encounter: Payer: Self-pay | Admitting: Cardiology

## 2021-04-25 VITALS — BP 103/75 | HR 80 | Temp 97.3°F | Resp 16 | Ht 62.0 in | Wt 164.4 lb

## 2021-04-25 DIAGNOSIS — R000 Tachycardia, unspecified: Secondary | ICD-10-CM

## 2021-04-25 DIAGNOSIS — E1165 Type 2 diabetes mellitus with hyperglycemia: Secondary | ICD-10-CM

## 2021-04-25 DIAGNOSIS — Z79890 Hormone replacement therapy (postmenopausal): Secondary | ICD-10-CM

## 2021-04-25 DIAGNOSIS — E782 Mixed hyperlipidemia: Secondary | ICD-10-CM

## 2021-04-25 DIAGNOSIS — R002 Palpitations: Secondary | ICD-10-CM

## 2021-04-25 DIAGNOSIS — R0600 Dyspnea, unspecified: Secondary | ICD-10-CM

## 2021-04-25 DIAGNOSIS — Z794 Long term (current) use of insulin: Secondary | ICD-10-CM

## 2021-04-25 DIAGNOSIS — R0609 Other forms of dyspnea: Secondary | ICD-10-CM

## 2021-04-25 DIAGNOSIS — E039 Hypothyroidism, unspecified: Secondary | ICD-10-CM

## 2021-04-25 DIAGNOSIS — R252 Cramp and spasm: Secondary | ICD-10-CM

## 2021-04-25 DIAGNOSIS — I10 Essential (primary) hypertension: Secondary | ICD-10-CM

## 2021-04-25 DIAGNOSIS — I700 Atherosclerosis of aorta: Secondary | ICD-10-CM

## 2021-04-25 DIAGNOSIS — R61 Generalized hyperhidrosis: Secondary | ICD-10-CM

## 2021-05-01 ENCOUNTER — Ambulatory Visit: Payer: 59

## 2021-05-01 ENCOUNTER — Other Ambulatory Visit: Payer: Self-pay

## 2021-05-01 DIAGNOSIS — R0609 Other forms of dyspnea: Secondary | ICD-10-CM

## 2021-05-01 DIAGNOSIS — R0600 Dyspnea, unspecified: Secondary | ICD-10-CM

## 2021-05-07 ENCOUNTER — Ambulatory Visit: Payer: 59

## 2021-05-07 ENCOUNTER — Other Ambulatory Visit: Payer: Self-pay

## 2021-05-07 DIAGNOSIS — R0600 Dyspnea, unspecified: Secondary | ICD-10-CM

## 2021-05-07 DIAGNOSIS — R0609 Other forms of dyspnea: Secondary | ICD-10-CM

## 2021-05-31 ENCOUNTER — Encounter: Payer: Self-pay | Admitting: Cardiology

## 2021-05-31 ENCOUNTER — Other Ambulatory Visit: Payer: Self-pay

## 2021-05-31 ENCOUNTER — Ambulatory Visit: Payer: 59 | Admitting: Cardiology

## 2021-05-31 VITALS — BP 115/71 | HR 86 | Temp 97.2°F | Resp 16 | Ht 62.0 in | Wt 166.6 lb

## 2021-05-31 DIAGNOSIS — E1165 Type 2 diabetes mellitus with hyperglycemia: Secondary | ICD-10-CM

## 2021-05-31 DIAGNOSIS — I10 Essential (primary) hypertension: Secondary | ICD-10-CM

## 2021-05-31 DIAGNOSIS — I700 Atherosclerosis of aorta: Secondary | ICD-10-CM

## 2021-05-31 DIAGNOSIS — E782 Mixed hyperlipidemia: Secondary | ICD-10-CM

## 2021-05-31 DIAGNOSIS — R0609 Other forms of dyspnea: Secondary | ICD-10-CM

## 2021-05-31 DIAGNOSIS — E039 Hypothyroidism, unspecified: Secondary | ICD-10-CM

## 2021-05-31 DIAGNOSIS — Z79890 Hormone replacement therapy (postmenopausal): Secondary | ICD-10-CM

## 2021-05-31 DIAGNOSIS — Z794 Long term (current) use of insulin: Secondary | ICD-10-CM

## 2021-05-31 DIAGNOSIS — R0600 Dyspnea, unspecified: Secondary | ICD-10-CM

## 2021-05-31 NOTE — Progress Notes (Addendum)
Date:  05/31/2021   ID:  Jearld Lesch, DOB [birth year], MRN [id number]  PCP:  Shon Baton, MD  Cardiologist:  Rex Kras, DO, [facility] (established care 04/10/2021) Former Cardiology Providers: NA  Date: 05/31/21 Last Office Visit: 04/25/2021  Chief Complaint  Patient presents with  . Dyspnea on exertion  . Results  . Follow-up    HPI  Rebecca Carroll is a 50 y.o. female who presents to the office with a chief complaint of " follow-up for evaluation of dyspnea and review test results." Patient's past medical history and cardiovascular risk factors include: New onset of diabetes mellitus , hypertension, hyperlipidemia, hypothyroidism, vitamin D deficiency, currently going through menopause on hormone replacement therapy.  She is referred to the office at the request of Shon Baton, MD for evaluation of tachycardia and dyspnea on exertion.  Given the new onset of diabetes mellitus type 2 insulin-dependent and effort related dyspnea that shared decision was to proceed with an ischemic evaluation at the last office visit.  Patient underwent an echo cardiogram and exercise nuclear stress test since last office visit.  Results were reviewed with her in great detail and noted below for further reference.  Clinically patient is doing well from a cardiovascular standpoint.  Patient states that her dyspnea on exertion has improved significantly.  She denies any chest pain at rest or with effort related activities.  Her hemoglobin A1c is closer to 8.1 compared to 10.9 in the recent past.  FUNCTIONAL STATUS: No structured exercise program or daily routine.   ALLERGIES: Allergies  Allergen Reactions  . Wheat Bran Nausea And Vomiting    MEDICATION LIST PRIOR TO VISIT: Current Meds  Medication Sig  . ALPRAZolam (XANAX) 0.5 MG tablet Take 1 tablet by mouth as needed.  Marland Kitchen amLODipine (NORVASC) 5 MG tablet Take 10 mg by mouth daily.  Marland Kitchen Apremilast (OTEZLA) 30 MG TABS Take 1 tablet (30 mg total) by mouth 2  (two) times daily.  Marland Kitchen aspirin EC 81 MG tablet Take 81 mg by mouth daily. Swallow whole.  . B-D UF III MINI PEN NEEDLES 31G X 5 MM MISC Inject into the skin.  . DULoxetine (CYMBALTA) 60 MG capsule Take 1 capsule (60 mg total) by mouth daily.  . insulin aspart (NOVOLOG) 100 UNIT/ML injection 10 units  . Insulin Degludec (TRESIBA) 100 UNIT/ML SOLN 15units  . Insulin Pen Needle (PEN NEEDLES) 31G X 5 MM MISC Use 1 pen needle with Rx of Tresiba and Rx of Humalog as directed  . Lancets (ONETOUCH DELICA PLUS [id number]) MISC   . levothyroxine (SYNTHROID, LEVOTHROID) 88 MCG tablet Take 100 mcg by mouth daily at 12 noon. Only on Thursday takes 164mg  . losartan-hydrochlorothiazide (HYZAAR) 100-25 MG tablet Take 1 tablet by mouth daily.  . metoprolol succinate (TOPROL-XL) 25 MG 24 hr tablet Take 25 mg by mouth daily.  . norethindrone-ethinyl estradiol (FEMHRT 1/5) 1-5 MG-MCG TABS tablet norethindrone acetate 1 mg-ethinyl estradiol 5 mcg tablet  TAKE 1 TABLET BY MOUTH EVERY DAY  . omeprazole (PRILOSEC) 20 MG capsule Take 1 capsule (20 mg total) by mouth daily.  . potassium chloride SA (KLOR-CON) 20 MEQ tablet Take 20 mEq by mouth daily.  . rosuvastatin (CRESTOR) 10 MG tablet Take 10 mg by mouth daily.     PAST MEDICAL HISTORY: Past Medical History:  Diagnosis Date  . Allergy   . Anemia   . Arthritis   . Celiac disease   . Diabetes mellitus without complication ([place]   . Essential hypertension,  benign   . Fibromyalgia   . GERD (gastroesophageal reflux disease)   . High cholesterol   . History of anemia   . Hypothyroidism   . Migraine   . Scalp psoriasis     PAST SURGICAL HISTORY: Past Surgical History:  Procedure Laterality Date  . ENDOMETRIAL ABLATION    . ESOPHAGOGASTRODUODENOSCOPY    . ROTATOR CUFF REPAIR Left   . TONSILLECTOMY      FAMILY HISTORY: The patient family history includes Breast cancer in her maternal aunt and maternal aunt; Cirrhosis in her paternal uncle; Colon  cancer in her paternal uncle; Colon polyps in her father; Diabetes in her son; Diverticulosis in her father; Heart disease in her maternal grandmother and paternal grandmother; Hypertension in her father; Lung cancer in her maternal aunt and maternal grandfather.  SOCIAL HISTORY:  The patient  reports that she has never smoked. She has never used smokeless tobacco. She reports current alcohol use. She reports that she does not use drugs.  REVIEW OF SYSTEMS: Review of Systems  Constitutional: Negative for chills, diaphoresis and fever.  HENT: Negative for hoarse voice and nosebleeds.   Eyes: Negative for discharge, double vision and pain.  Cardiovascular: Positive for dyspnea on exertion (improved). Negative for chest pain, claudication, leg swelling, near-syncope, orthopnea, palpitations, paroxysmal nocturnal dyspnea and syncope.  Respiratory: Negative for hemoptysis and shortness of breath.   Endocrine: Negative for cold intolerance, heat intolerance, polydipsia and polyuria.  Musculoskeletal: Negative for muscle cramps and myalgias.  Gastrointestinal: Negative for abdominal pain, constipation, diarrhea, heartburn, hematemesis, hematochezia, melena, nausea and vomiting.  Neurological: Negative for dizziness and light-headedness.    PHYSICAL EXAM: Vitals with BMI 05/31/2021 04/25/2021 04/10/2021  Height '5\' 2"'  '5\' 2"'  -  Weight 166 lbs 10 oz 164 lbs 6 oz -  BMI 73.53 29.92 -  Systolic 426 834 196  Diastolic 71 75 99  Pulse 86 80 96  Some encounter information is confidential and restricted. Go to Review Flowsheets activity to see all data.    CONSTITUTIONAL: Well-developed and well-nourished. No acute distress.  SKIN: Skin is warm and dry. No rash noted. No cyanosis. No pallor. No jaundice HEAD: Normocephalic and atraumatic.  EYES: No scleral icterus MOUTH/THROAT: Moist oral membranes.  NECK: No JVD present. No thyromegaly noted. No carotid bruits  LYMPHATIC: No visible cervical  adenopathy.  CHEST Normal respiratory effort. No intercostal retractions  LUNGS: Clear to auscultation bilaterally.  No stridor. No wheezes. No rales.  CARDIOVASCULAR: Positive S1-S2, no murmurs rubs or gallops appreciated. ABDOMINAL: Soft, nontender, nondistended, positive bowel sounds in all 4 quadrants no apparent ascites.  EXTREMITIES: No peripheral edema.  trace bilateral posterior tibial pulses. HEMATOLOGIC: No significant bruising NEUROLOGIC: Oriented to person, place, and time. Nonfocal. Normal muscle tone.  PSYCHIATRIC: Normal mood and affect. Normal behavior. Cooperative  RADIOLOGY:  CTA CHEST PE PROTOCOL:  04/10/2021 No pulmonary embolus or acute intrathoracic abnormality. Aortic Atherosclerosis (ICD10-I70.0).  CARDIAC DATABASE: EKG: 04/10/2021: Normal sinus rhythm, 99 bpm, left axis deviation, without underlying ischemia or injury pattern.  Echocardiogram: 05/01/2021: Left ventricle cavity is normal in size. Moderate concentric hypertrophy of the left ventricle. Normal global wall motion. Normal LV systolic function with EF 58%.  Normal diastolic filling pattern.  Mild tricuspid regurgitation.  No evidence of pulmonary hypertension.  Stress Testing: Exercise Sestamibi stress test 05/07/2021: Exercise nuclear stress test was performed using Bruce protocol. Patient reached 8.5 METS, and 92% of age predicted maximum heart rate. Exercise capacity was good. No chest pain reported. Heart  rate and hemodynamic response were normal. Stress EKG revealed no ischemic changes. Mildly decreased tracer uptake in inferior myocardium at rest and stress likely due to breast tissue attenuation, with imaging performed in sitting position. Stress LVEF 54%. Low risk study.  Heart Catheterization: None  LABORATORY DATA: CBC Latest Ref Rng & Units 04/10/2021 04/10/2021 03/30/2020  WBC 4.0 - 10.5 K/uL - 8.2 6.7  Hemoglobin 12.0 - 15.0 g/dL 13.6 13.5 12.1  Hematocrit 36.0 - 46.0 % 40.0 39.3 36.5   Platelets 150 - 400 K/uL - 318 253    CMP Latest Ref Rng & Units 04/10/2021 04/10/2021 03/30/2020  Glucose 70 - 99 mg/dL 268(H) 270(H) 200(H)  BUN 6 - 20 mg/dL 22(H) 19 13  Creatinine 0.44 - 1.00 mg/dL 0.50 0.78 0.81  Sodium 135 - 145 mmol/L 134(L) 132(L) 140  Potassium 3.5 - 5.1 mmol/L 3.6 3.7 3.4(L)  Chloride 98 - 111 mmol/L 101 98 97(L)  CO2 22 - 32 mmol/L - 19(L) 32  Calcium 8.9 - 10.3 mg/dL - 10.9(H) 9.4  Total Protein 6.1 - 8.1 g/dL - - 7.5  Total Bilirubin 0.2 - 1.2 mg/dL - - 0.4  Alkaline Phos 39 - 117 IU/L - - -  AST 10 - 35 U/L - - 26  ALT 6 - 29 U/L - - 25    Lipid Panel  Lab Results  Component Value Date   CHOL 252 (H) 05/18/2015   HDL 54 05/18/2015   LDLCALC 155 (H) 05/18/2015   TRIG 217 (H) 05/18/2015   CHOLHDL 4.7 (H) 05/18/2015    No components found for: NTPROBNP No results for input(s): PROBNP in the last 8760 hours. Recent Labs    04/10/21 1827  TSH 9.499*    BMP Recent Labs    04/10/21 1917 04/10/21 2001  NA 132* 134*  K 3.7 3.6  CL 98 101  CO2 19*  --   GLUCOSE 270* 268*  BUN 19 22*  CREATININE 0.78 0.50  CALCIUM 10.9*  --   GFRNONAA >60  --     HEMOGLOBIN A1C Lab Results  Component Value Date   HGBA1C 5.6 05/31/2013   External Labs: 08/09/2020: Total cholesterol 137, triglycerides 177, HDL 56, LDL 46, non-HDL 81.  Collected: 04/09/2021 provided by PCP. Creatinine 1.1 mg/dL. eGFR: 52.2 mL/min per 1.73 m Hemoglobin A1c: 10.9 Glucose 548, sodium 125, potassium 4.1, chloride 93, bicarb 18,  AST 119, ALT 83, alkaline phosphatase 79  IMPRESSION:    ICD-10-CM   1. Dyspnea on exertion  R06.00   2. Type 2 diabetes mellitus with hyperglycemia, with long-term current use of insulin (HCC)  E11.65    Z79.4   3. Long-term insulin use (HCC)  Z79.4   4. Aortic atherosclerosis (HCC)  I70.0   5. Benign hypertension  I10   6. Mixed hyperlipidemia  E78.2   7. Hypothyroidism, unspecified type  E03.9   8. Hormone replacement therapy   Z79.890      RECOMMENDATIONS: Rebecca Carroll is a 50 y.o. female whose past medical history and cardiac risk factors include: New onset of diabetes mellitus , hypertension, hyperlipidemia, hypothyroidism, vitamin D deficiency, currently going through menopause on hormone replacement therapy.  Dyspnea on exertion:   Improving.  Patient has undergone ischemic evaluation since last office visit which is overall favorable.  I have encouraged her to increase her physical activity to 30 minutes a day 5 days a week as tolerated.  No additional work-up warranted at this time.  New diagnosed insulin-dependent diabetes mellitus  type 2:   Improving.  Repeat A1c in May 2022 is now 8.1 compared to 10.9 in April 2022.  May consider Jardiance as a oral antiglycemic agent.  Will defer to PCP at this time.  Currently managed by primary care provider.  Benign essential hypertension:   Improving . Office blood pressure is at goal.  . Medication reconciled.  . Low salt diet recommended. A diet that is rich in fruits, vegetables, legumes, and low-fat dairy products and low in snacks, sweets, and meats (such as the Dietary Approaches to Stop Hypertension [DASH] diet).   Mixed hyperlipidemia:   Currently on rosuvastatin.  Last lipid profile reviewed via [facility] Everywhere.  Currently managed by primary care provider.  Does not endorse any significant myalgias   Clinically I feel patient is doing well from a cardiovascular standpoint.  I would like to see her back in 6 months.  Patient is asked to bring in her most recent blood work at the next office visit; however, if not performed she can call the office and we will check a CMP, fasting lipid profile.  FINAL MEDICATION LIST END OF ENCOUNTER: No orders of the defined types were placed in this encounter.   There are no discontinued medications.   Current Outpatient Medications:  .  ALPRAZolam (XANAX) 0.5 MG tablet, Take 1 tablet by mouth as  needed., Disp: , Rfl:  .  amLODipine (NORVASC) 5 MG tablet, Take 10 mg by mouth daily., Disp: , Rfl:  .  Apremilast (OTEZLA) 30 MG TABS, Take 1 tablet (30 mg total) by mouth 2 (two) times daily., Disp: 60 tablet, Rfl: 1 .  aspirin EC 81 MG tablet, Take 81 mg by mouth daily. Swallow whole., Disp: , Rfl:  .  B-D UF III MINI PEN NEEDLES 31G X 5 MM MISC, Inject into the skin., Disp: , Rfl:  .  DULoxetine (CYMBALTA) 60 MG capsule, Take 1 capsule (60 mg total) by mouth daily., Disp: 90 capsule, Rfl: 2 .  insulin aspart (NOVOLOG) 100 UNIT/ML injection, 10 units, Disp: , Rfl:  .  Insulin Degludec (TRESIBA) 100 UNIT/ML SOLN, 15units, Disp: , Rfl:  .  Insulin Pen Needle (PEN NEEDLES) 31G X 5 MM MISC, Use 1 pen needle with Rx of Tresiba and Rx of Humalog as directed, Disp: , Rfl:  .  Lancets (ONETOUCH DELICA PLUS [id number]) MISC, , Disp: , Rfl:  .  levothyroxine (SYNTHROID, LEVOTHROID) 88 MCG tablet, Take 100 mcg by mouth daily at 12 noon. Only on Thursday takes 157mg, Disp: , Rfl:  .  losartan-hydrochlorothiazide (HYZAAR) 100-25 MG tablet, Take 1 tablet by mouth daily., Disp: , Rfl:  .  metoprolol succinate (TOPROL-XL) 25 MG 24 hr tablet, Take 25 mg by mouth daily., Disp: , Rfl:  .  norethindrone-ethinyl estradiol (FEMHRT 1/5) 1-5 MG-MCG TABS tablet, norethindrone acetate 1 mg-ethinyl estradiol 5 mcg tablet  TAKE 1 TABLET BY MOUTH EVERY DAY, Disp: , Rfl:  .  omeprazole (PRILOSEC) 20 MG capsule, Take 1 capsule (20 mg total) by mouth daily., Disp: 30 capsule, Rfl: 0 .  potassium chloride SA (KLOR-CON) 20 MEQ tablet, Take 20 mEq by mouth daily., Disp: , Rfl:  .  rosuvastatin (CRESTOR) 10 MG tablet, Take 10 mg by mouth daily., Disp: , Rfl:   No orders of the defined types were placed in this encounter.   There are no Patient Instructions on file for this visit.   --Continue cardiac medications as reconciled in final medication list. --Return in about 6 months (  around 11/30/2021) for Follow up, Dyspnea. Or  sooner if needed. --Continue follow-up with your primary care physician regarding the management of your other chronic comorbid conditions.  Patient's questions and concerns were addressed to her satisfaction. She voices understanding of the instructions provided during this encounter.   This note was created using a voice recognition software as a result there may be grammatical errors inadvertently enclosed that do not reflect the nature of this encounter. Every attempt is made to correct such errors.  Rex Kras, [place], [facility]  Pager: [phone] Office: [phone]

## 2021-08-29 ENCOUNTER — Ambulatory Visit: Payer: 59 | Admitting: Physician Assistant

## 2021-08-29 ENCOUNTER — Other Ambulatory Visit: Payer: Self-pay | Admitting: Obstetrics and Gynecology

## 2021-08-29 ENCOUNTER — Encounter: Payer: Self-pay | Admitting: Physician Assistant

## 2021-08-29 ENCOUNTER — Other Ambulatory Visit: Payer: Self-pay

## 2021-08-29 ENCOUNTER — Telehealth: Payer: Self-pay

## 2021-08-29 DIAGNOSIS — L4050 Arthropathic psoriasis, unspecified: Secondary | ICD-10-CM

## 2021-08-29 DIAGNOSIS — L409 Psoriasis, unspecified: Secondary | ICD-10-CM

## 2021-08-29 MED ORDER — OTEZLA 30 MG PO TABS: 30.0000 mg | ORAL_TABLET | Freq: Two times a day (BID) | ORAL | 11 refills | Status: DC

## 2021-08-29 NOTE — Progress Notes (Signed)
   Follow-Up Visit   Subjective  Rebecca Carroll is a 50 y.o. female who presents for the following: Follow-up (Here for psoriasis follow up. Patient on otezla. She has not taken it in a few months so she is flared up. She says the [place] keeps her clear when she takes it. ) She has no adverse side effects to this date.    The following portions of the chart were reviewed this encounter and updated as appropriate:  Tobacco  Allergies  Meds  Problems  Med Hx  Surg Hx  Fam Hx      Objective  Well appearing patient in no apparent distress; mood and affect are within normal limits.  All skin waist up examined.  Mid Frontal Scalp, Mid Occipital Scalp, Mid Parietal Scalp, Right Occipital Scalp Thick plaques with significant scale.   Left Knee - Anterior, Left Upper Arm - Anterior, Right Ankle - Anterior, Right Antecubital Fossa, Right Knee - Anterior Joint pain am- improves with time during the day.   Assessment & Plan  Psoriasis Right Occipital Scalp; Mid Frontal Scalp; Mid Parietal Scalp; Mid Occipital Scalp  Continue Otezla. No TB gold per KRS.  Apremilast (OTEZLA) 30 MG TABS - Mid Frontal Scalp, Mid Occipital Scalp, Mid Parietal Scalp, Right Occipital Scalp Take 1 tablet (30 mg total) by mouth 2 (two) times daily.  Psoriatic arthritis ([place]) Right Antecubital Fossa; Left Knee - Anterior; Right Knee - Anterior; Right Ankle - Anterior; Left Upper Arm - Anterior  Apremilast (OTEZLA) 30 MG TABS - Left Knee - Anterior, Left Upper Arm - Anterior, Right Ankle - Anterior, Right Antecubital Fossa, Right Knee - Anterior Take 1 tablet (30 mg total) by mouth 2 (two) times daily.   I, Jacquelin Krajewski, PA-C, have reviewed all documentation's for this visit.  The documentation on 08/29/21 for the exam, diagnosis, procedures and orders are all accurate and complete.

## 2021-08-29 NOTE — Telephone Encounter (Signed)
Prior authorization done for the patient's Rutherford Nail done through Cover My Meds.    Your information has been submitted to [place]. To check for an updated outcome later, reopen this PA request from your dashboard.  If [facility] has not responded to your request within 24 hours, contact [employer] at [phone]. If you think there may be a problem with your PA request, use our live chat feature at the bottom right.

## 2021-08-29 NOTE — Telephone Encounter (Signed)
Fax received from [place] requesting prior authorization for the patient's Otezla.

## 2021-08-30 NOTE — Telephone Encounter (Signed)
Faxed over the [facility] notes [facility] for PA for [phone]

## 2021-09-12 ENCOUNTER — Telehealth: Payer: Self-pay | Admitting: Physician Assistant

## 2021-09-12 MED ORDER — OTEZLA 10 & 20 & 30 MG PO TBPK: ORAL_TABLET | ORAL | 0 refills | Status: DC

## 2021-09-12 NOTE — Addendum Note (Signed)
Addended by: Sheran Lawless on: 09/12/2021 03:45 PM   Modules accepted: Orders

## 2021-09-12 NOTE — Telephone Encounter (Signed)
Me     9:39 AM Note Faxed over the office notes to [facility] for PA for [phone]     August 29, 2021 Lennie Odor, CMA     4:26 PM Note Prior authorization done for the patient's Rutherford Nail done through Cover My Meds.      Patient aware approval number [id number] and the dates are 09/12/21- 09/11/2022 and I advised th patient to call and have medication shipped [phone]

## 2021-09-12 NOTE — Telephone Encounter (Signed)
Patient left message on office voice mail that she was calling to check on the status of her Rutherford Nail prescription at [facility].  Patient stated that she knew coverage had been denied and that more information needed to be sent to the pharmacy by this office.  Patient would like a phone call back when the pharmacy requested information has been sent because she needs her prescription.

## 2021-09-12 NOTE — Telephone Encounter (Signed)
[facility] needed starter dose pack no rx on file

## 2021-09-13 ENCOUNTER — Telehealth: Payer: Self-pay | Admitting: *Deleted

## 2021-09-13 NOTE — Telephone Encounter (Signed)
Otezla '30mg'$  approved 09/12/2021- 09/12/2022.

## 2021-09-17 ENCOUNTER — Telehealth: Payer: Self-pay | Admitting: Physician Assistant

## 2021-09-17 ENCOUNTER — Telehealth: Payer: Self-pay | Admitting: *Deleted

## 2021-09-17 NOTE — Telephone Encounter (Signed)
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 Sent to [facility] [place] for clarification/next step.

## 2021-09-17 NOTE — Telephone Encounter (Signed)
I spoke with Rebecca Carroll regarding [facility] asking to discontinue either her Simponi or her [place]. Since she has severe psoriasis on her scalp and psoriatic arthritis which can destroy her joints we will consult with her Rheumatologist Dr. Jenetta Downer at [facility]. We can discuss maybe changing to a medication which can be taken for both diseases. She will call me back after she speaks to him.

## 2021-10-02 ENCOUNTER — Telehealth: Payer: Self-pay | Admitting: Physician Assistant

## 2021-10-02 DIAGNOSIS — L4050 Arthropathic psoriasis, unspecified: Secondary | ICD-10-CM

## 2021-10-02 DIAGNOSIS — L409 Psoriasis, unspecified: Secondary | ICD-10-CM

## 2021-10-02 DIAGNOSIS — Z5181 Encounter for therapeutic drug level monitoring: Secondary | ICD-10-CM

## 2021-10-18 ENCOUNTER — Encounter: Payer: Self-pay | Admitting: Physician Assistant

## 2021-10-22 NOTE — Telephone Encounter (Signed)
I spoke directly with Dr. Aryl (rheumatologist at [facility] has denied the use of [place] and Simponi. She has been doing well with these two medications for years. Discussed a treatment for her arthritis and psoriasis with Dr. Aryl. We agreed that Tremfya is a good choice.I will get the process started for approval. Until then she will continue [place].

## 2021-10-22 NOTE — Addendum Note (Signed)
Addended by: Ailene Rud on: 10/22/2021 03:14 PM   Modules accepted: Orders

## 2021-10-22 NOTE — Telephone Encounter (Signed)
Phone call to patient to let her know Rebecca Carroll wanted to start her on the Tremfya. Per patient she has already been on that and it didn't help I will pull her old chart to verify. I sent in patient labs for new biologic start and told her once I speak to [facility] I will call with an update.

## 2021-10-25 NOTE — Telephone Encounter (Signed)
Phone call to patient to see if she's been to the lab to get her blood work done. Per patient she is going today for her blood work. I informed patient that once we receive those results we will start the approval process for [facility].

## 2021-11-08 LAB — CBC WITH DIFFERENTIAL/PLATELET
Absolute Monocytes: 338 cells/uL (ref 200–950)
Basophils Absolute: 72 cells/uL (ref 0–200)
Basophils Relative: 1.1 %
Eosinophils Absolute: 267 cells/uL (ref 15–500)
Eosinophils Relative: 4.1 %
HCT: 36.6 % (ref 35.0–45.0)
Hemoglobin: 12.4 g/dL (ref 11.7–15.5)
Lymphs Abs: 1385 cells/uL (ref 850–3900)
MCH: 30.2 pg (ref 27.0–33.0)
MCHC: 33.9 g/dL (ref 32.0–36.0)
MCV: 89.3 fL (ref 80.0–100.0)
MPV: 11.3 fL (ref 7.5–12.5)
Monocytes Relative: 5.2 %
Neutro Abs: 4440 cells/uL (ref 1500–7800)
Neutrophils Relative %: 68.3 %
Platelets: 319 10*3/uL (ref 140–400)
RBC: 4.1 10*6/uL (ref 3.80–5.10)
RDW: 13 % (ref 11.0–15.0)
Total Lymphocyte: 21.3 %
WBC: 6.5 10*3/uL (ref 3.8–10.8)

## 2021-11-08 LAB — QUANTIFERON-TB GOLD PLUS
Mitogen-NIL: 8.71 IU/mL
NIL: 0.02 IU/mL
QuantiFERON-TB Gold Plus: NEGATIVE
TB1-NIL: 0.01 IU/mL
TB2-NIL: 0.02 IU/mL

## 2021-11-08 LAB — COMPREHENSIVE METABOLIC PANEL
AG Ratio: 1.2 (calc) (ref 1.0–2.5)
ALT: 15 U/L (ref 6–29)
AST: 17 U/L (ref 10–35)
Albumin: 4.4 g/dL (ref 3.6–5.1)
Alkaline phosphatase (APISO): 70 U/L (ref 37–153)
BUN: 14 mg/dL (ref 7–25)
CO2: 25 mmol/L (ref 20–32)
Calcium: 10 mg/dL (ref 8.6–10.4)
Chloride: 101 mmol/L (ref 98–110)
Creat: 0.76 mg/dL (ref 0.50–1.03)
Globulin: 3.6 g/dL (calc) (ref 1.9–3.7)
Glucose, Bld: 179 mg/dL — ABNORMAL HIGH (ref 65–139)
Potassium: 3.8 mmol/L (ref 3.5–5.3)
Sodium: 140 mmol/L (ref 135–146)
Total Bilirubin: 0.4 mg/dL (ref 0.2–1.2)
Total Protein: 8 g/dL (ref 6.1–8.1)

## 2021-11-08 LAB — HEPATITIS C ANTIBODY
Hepatitis C Ab: NONREACTIVE
SIGNAL TO CUT-OFF: 0.08 (ref <1.00)

## 2021-11-08 LAB — ANTI-NUCLEAR AB-TITER (ANA TITER): ANA Titer 1: 1:80 {titer} — ABNORMAL HIGH

## 2021-11-08 LAB — HEPATITIS B SURFACE ANTIBODY, QUANTITATIVE: Hep B S AB Quant (Post): <5 m[IU]/mL — ABNORMAL LOW (ref >=10)

## 2021-11-08 LAB — ANA: Anti Nuclear Antibody (ANA): POSITIVE — AB

## 2021-11-08 LAB — HEPATITIS B CORE ANTIBODY, TOTAL: Hep B Core Total Ab: REACTIVE — AB

## 2021-11-08 LAB — HEPATITIS B SURFACE ANTIGEN: Hepatitis B Surface Ag: NONREACTIVE

## 2021-11-09 ENCOUNTER — Encounter: Payer: Self-pay | Admitting: Physician Assistant

## 2021-11-12 ENCOUNTER — Telehealth: Payer: Self-pay | Admitting: *Deleted

## 2021-11-12 NOTE — Telephone Encounter (Signed)
Labs sent to provider to review and advise

## 2021-11-12 NOTE — Telephone Encounter (Signed)
Gave her results of labs. There were some abnormalities in the ANA. No evidence of prior. I will send these to her rheumatologist. Dr. Aryl. She was instructed to follow up with him. We will start approval of Skyrizi.

## 2021-11-14 ENCOUNTER — Encounter: Payer: Self-pay | Admitting: Physician Assistant

## 2021-11-14 NOTE — Telephone Encounter (Signed)
Phone call to patient to inform her that we have started the process for her Orson Ape and she may receive a call from [place] or [employer] complete. I also informed patient that it shows that her Rheumatologist has care everywhere so he should be able to see her lab results. Patient aware.

## 2021-11-14 NOTE — Progress Notes (Signed)
This encounter was created in error - please disregard.

## 2021-11-28 ENCOUNTER — Telehealth: Payer: Self-pay | Admitting: *Deleted

## 2021-11-28 NOTE — Telephone Encounter (Signed)
Faxed over the office notes to [facility] for patients skyrizi prior authorization.

## 2021-11-30 ENCOUNTER — Ambulatory Visit: Payer: 59 | Admitting: Cardiology

## 2021-12-05 ENCOUNTER — Encounter: Payer: Self-pay | Admitting: Physician Assistant

## 2022-01-03 ENCOUNTER — Telehealth: Payer: Self-pay | Admitting: Internal Medicine

## 2022-01-03 NOTE — Telephone Encounter (Signed)
Patient called states every time she has a BM bright red blood comes out and she does not have hemorrhoids seeking advise.

## 2022-01-03 NOTE — Telephone Encounter (Signed)
Pt reports she has had rectal bleeding for the last year although it has happened more consistently the last 4-5 months. Pt reports stool is formed and has about 2 BM's per day. Pt denied any shortness of breath but stated she sometimes has dizziness when moving from sitting to standing. Pt states occasionally when she has a BM she will have severe abdominal pain that moves from upper abd to lower abd and feels hot and flushed. Pt states she will then lay down for about 2 hours and pain goes away. Pt also reports she had an episode of bleeding between bowel movements today which is why she called. Soonest available appt is 01/10/22. Advised pt to go to urgent care or contact PCP to see if they had sooner appt  Pt verbalized understanding.  Pt unable to schedule appt on 1/67 due to conflict. Scheduled pt on 01/11/22 at 1:30 with Tye Savoy.

## 2022-01-07 ENCOUNTER — Telehealth: Payer: Self-pay

## 2022-01-07 NOTE — Telephone Encounter (Signed)
Fax received from [place] stating that the patient's Orson Ape is approved from 01/04/2022 to 01/04/2023.

## 2022-01-11 ENCOUNTER — Encounter: Payer: Self-pay | Admitting: Gastroenterology

## 2022-01-11 ENCOUNTER — Ambulatory Visit: Payer: 59 | Admitting: Gastroenterology

## 2022-01-11 VITALS — BP 104/78 | HR 94 | Ht 62.0 in | Wt 148.5 lb

## 2022-01-11 DIAGNOSIS — K625 Hemorrhage of anus and rectum: Secondary | ICD-10-CM

## 2022-01-11 DIAGNOSIS — R1084 Generalized abdominal pain: Secondary | ICD-10-CM

## 2022-01-11 DIAGNOSIS — K648 Other hemorrhoids: Secondary | ICD-10-CM

## 2022-01-11 MED ORDER — HYOSCYAMINE SULFATE SL 0.125 MG SL SUBL: 0.1250 mg | SUBLINGUAL_TABLET | Freq: Four times a day (QID) | SUBLINGUAL | 1 refills | Status: AC | PRN

## 2022-01-11 MED ORDER — HYDROCORTISONE ACETATE 25 MG RE SUPP: 25.0000 mg | Freq: Every day | RECTAL | 0 refills | Status: AC

## 2022-01-11 NOTE — Progress Notes (Signed)
01/11/2022 Rebecca Carroll [id number] 1971-09-07   HISTORY OF PRESENT ILLNESS: This is a 51 year old female who is a patient of Dr. Vena Rua.  She presents here today with complaints of bright red blood with bowel movements for the past year.  She says that this occurs intermittently, but becoming more frequent over the past few months.  Denies any rectal pain.  Colonoscopy 05/2020:  - One 8 mm polyp in the proximal descending colon, removed with a cold snare. Resected and retrieved. - One 3 mm polyp in the sigmoid colon, removed with a cold snare. Resected and retrieved. - The examination was otherwise normal on direct and retroflexion views.  Pathology showed tubular adenoma and hyperplastic polyp.  While she is here she also reports episodes of intermittent abdominal pain that begins in the upper abdomen and then radiates down into her lower abdomen.  This is companied by diaphoresis while trying to have a bowel movement.  She says that when she has a bowel movement it is normal, not diarrhea.  She has celiac disease and adheres to a gluten-free diet fully.  Routine labs from November 2022 look good.  She was diagnosed with diabetes and started on Ozempic, etc., but she says that the episodes of abdominal pain began prior to beginning these medications.   Past Medical History:  Diagnosis Date   Allergy    Anemia    Arthritis    Celiac disease    Diabetes mellitus without complication (HCC)    Essential hypertension, benign    Fibromyalgia    GERD (gastroesophageal reflux disease)    High cholesterol    History of anemia    Hypothyroidism    Migraine    Scalp psoriasis    Past Surgical History:  Procedure Laterality Date   ENDOMETRIAL ABLATION     ESOPHAGOGASTRODUODENOSCOPY     ROTATOR CUFF REPAIR Left    TONSILLECTOMY      reports that she has never smoked. She has never used smokeless tobacco. She reports current alcohol use. She reports that she does not use  drugs. family history includes Breast cancer in her maternal aunt and maternal aunt; Cirrhosis in her paternal uncle; Colon cancer in her paternal uncle; Colon polyps in her father; Diabetes in her son; Diverticulosis in her father; Heart disease in her maternal grandmother and paternal grandmother; Hypertension in her father; Lung cancer in her maternal aunt and maternal grandfather. Allergies  Allergen Reactions   Wheat Bran Nausea And Vomiting      Outpatient Encounter Medications as of 01/11/2022  Medication Sig   ALPRAZolam (XANAX) 0.5 MG tablet Take 1 tablet by mouth as needed.   amLODipine (NORVASC) 10 MG tablet amlodipine 10 mg tablet  TAKE 1 TABLET BY MOUTH EVERY DAY FOR 90 DAYS   aspirin EC 81 MG tablet Take 81 mg by mouth daily. Swallow whole.   B-D UF III MINI PEN NEEDLES 31G X 5 MM MISC Inject into the skin.   DULoxetine (CYMBALTA) 60 MG capsule Take 1 capsule by mouth daily.   Insulin Pen Needle (PEN NEEDLES) 31G X 5 MM MISC Use 1 pen needle with Rx of Tresiba and Rx of Humalog as directed   Lancets (ONETOUCH DELICA PLUS [id number]) MISC    levothyroxine (SYNTHROID) 100 MCG tablet levothyroxine 100 mcg tablet  TAKE 1 TABLET BY MOUTH EVERY DAY EXCEPT ON TUESDAY AND THURSDAY TAKE 1 AND A HALF TABLETS 90   losartan-hydrochlorothiazide (HYZAAR) 100-25 MG tablet Take 1 tablet by  mouth daily.   metoprolol succinate (TOPROL-XL) 25 MG 24 hr tablet Take 25 mg by mouth daily.   norethindrone-ethinyl estradiol (FEMHRT 1/5) 1-5 MG-MCG TABS tablet norethindrone acetate 1 mg-ethinyl estradiol 5 mcg tablet  TAKE 1 TABLET BY MOUTH EVERY DAY   omeprazole (PRILOSEC) 20 MG capsule Take 1 capsule (20 mg total) by mouth daily.   ONETOUCH VERIO test strip 3 (three) times daily.   rosuvastatin (CRESTOR) 10 MG tablet Take 10 mg by mouth daily.   Semaglutide, 1 MG/DOSE, (OZEMPIC, 1 MG/DOSE,) 4 MG/3ML SOPN Ozempic 1 mg/dose (4 mg/3 mL) subcutaneous pen injector  INJECT 1MG  SUBCUTANEOUS ONCE A WEEK 30  DAYS (ON BACKORDER)   TRESIBA FLEXTOUCH 100 UNIT/ML FlexTouch Pen SMARTSIG:20 Unit(s) SUB-Q Daily   Apremilast (OTEZLA) 10 & 20 & 30 MG TBPK Take as directed per package instructions. (Patient not taking: Reported on 01/11/2022)   potassium chloride SA (KLOR-CON) 20 MEQ tablet Take 20 mEq by mouth daily. (Patient not taking: Reported on 01/11/2022)   SIMPONI ARIA 50 MG/4ML SOLN injection Inject into the vein. (Patient not taking: Reported on 01/11/2022)   No facility-administered encounter medications on file as of 01/11/2022.     REVIEW OF SYSTEMS  : All other systems reviewed and negative except where noted in the History of Present Illness.   PHYSICAL EXAM: BP 104/78 (BP Location: Left Arm, Patient Position: Sitting)    Pulse 94    Ht 5\' 2"  (1.575 m)    Wt 148 lb 8 oz (67.4 kg)    SpO2 97%    BMI 27.16 kg/m  General: Well developed white female in no acute distress Head: Normocephalic and atraumatic Eyes:  Sclerae anicteric, conjunctiva pink. Ears: Normal auditory acuity Lungs: Clear throughout to auscultation; no W/R/R. Heart: Regular rate and rhythm; no M/R/G. Abdomen: Soft, non-distended.  BS present.  Mild upper abdominal TTP. Rectal:  No external abnormalities noted.  DRE did not reveal any masses.  Anoscopy was performed and revealed an inflamed internal hemorrhoid left posterior region. Musculoskeletal: Symmetrical with no gross deformities  Skin: No lesions on visible extremities Extremities: No edema  Neurological: Alert oriented x 4, grossly non-focal Psychological:  Alert and cooperative. Normal mood and affect  ASSESSMENT AND PLAN: *Rectal bleeding: Intermittently in the past year, but more consistent in the past few months.  Occurs with BMs.  She does have an internal hemorrhoid posteriorly and to the left that is inflamed with the stigmata of recent bleeding.  We discussed internal hemorrhoid banding.  She may want to consider this and she was given literature on it.  She  has not tried any hydrocortisone, however, so she is going to try hydrocortisone suppositories at nighttime for the next week or so.  If bleeding is recurrent frequently then she may call back to schedule banding about Dr. Hilarie Fredrickson.  Prescription sent to pharmacy.   *Generalized abdominal pain: Reports intermittent episodes of crampy abdominal pain and diaphoresis when trying to have a bowel movement.  Stool is normal when this occurs.  Pain improved somewhat with a bowel movement, but lingers to a degree.  We discussed possible CT scan.  We also discussed trial of antispasmodic for when these episodes occur and she would like to try that first.  Prescription for Levsin to be used as needed was sent to her pharmacy.   CC:  Shon Baton, MD

## 2022-01-11 NOTE — Patient Instructions (Signed)
We have sent the following medications to your pharmacy for you to pick up at your convenience: Levsin and hydrocortisone suppositories   Please call back to schedule an appointment with Dr. Hilarie Fredrickson for hemorrhoid banding if you are interested   If you are age 53 or older, your body mass index should be between 23-30. Your Body mass index is 27.16 kg/m. If this is out of the aforementioned range listed, please consider follow up with your Primary Care Provider.  If you are age 33 or younger, your body mass index should be between 19-25. Your Body mass index is 27.16 kg/m. If this is out of the aformentioned range listed, please consider follow up with your Primary Care Provider.   ________________________________________________________  The [facility] providers would like to encourage you to use [facility] to communicate with providers for non-urgent requests or questions.  Due to long hold times on the telephone, sending your provider a message by [facility] may be a faster and more efficient way to get a response.  Please allow 48 business hours for a response.  Please remember that this is for non-urgent requests.  _______________________________________________________ Due to recent changes in healthcare laws, you may see the results of your imaging and laboratory studies on MyChart before your provider has had a chance to review them.  We understand that in some cases there may be results that are confusing or concerning to you. Not all laboratory results come back in the same time frame and the provider may be waiting for multiple results in order to interpret others.  Please give [place] 48 hours in order for your provider to thoroughly review all the results before contacting the office for clarification of your results.

## 2022-01-21 NOTE — Progress Notes (Signed)
Addendum: Reviewed and agree with assessment and management plan. Amay Mijangos M, MD  

## 2022-01-28 ENCOUNTER — Encounter: Payer: Self-pay | Admitting: Podiatry

## 2022-01-28 ENCOUNTER — Telehealth: Payer: Self-pay

## 2022-01-28 ENCOUNTER — Ambulatory Visit: Payer: 59 | Admitting: Podiatry

## 2022-01-28 ENCOUNTER — Other Ambulatory Visit: Payer: Self-pay

## 2022-01-28 DIAGNOSIS — L600 Ingrowing nail: Secondary | ICD-10-CM | POA: Diagnosis not present

## 2022-01-28 MED ORDER — CEPHALEXIN 500 MG PO CAPS: 500.0000 mg | ORAL_CAPSULE | Freq: Four times a day (QID) | ORAL | 0 refills | Status: AC

## 2022-01-28 NOTE — Progress Notes (Signed)
°  Subjective:  Patient ID: Rebecca Carroll, female    DOB: 03-Mar-1971,   MRN: [id number]  Chief Complaint  Patient presents with   Toe Pain    Pt is here due to right great toe pain for several months. Reports soreness and redness     51 y.o. female presents for concern of right great to pain and soreness for the past several months. Relates redness in the area. Has tried epsom salt soaks and neosporin and has also tried to cut the nail without much relief.  Denies any other pedal complaints. Denies n/v/f/c.   Past Medical History:  Diagnosis Date   Allergy    Anemia    Arthritis    Celiac disease    Diabetes mellitus without complication (HCC)    Essential hypertension, benign    Fibromyalgia    GERD (gastroesophageal reflux disease)    High cholesterol    History of anemia    Hypothyroidism    Migraine    Scalp psoriasis     Objective:  Physical Exam: Vascular: DP/PT pulses 2/4 bilateral. CFT <3 seconds. Normal hair growth on digits. No edema.  Skin. No lacerations or abrasions bilateral feet. Right hallux bilateral borders incurvated with mild edema and erythema surrounding as well as tender to both borders.  Musculoskeletal: MMT 5/5 bilateral lower extremities in DF, PF, Inversion and Eversion. Deceased ROM in DF of ankle joint.  Neurological: Sensation intact to light touch.   Assessment:  No diagnosis found.   Plan:  Patient was evaluated and treated and all questions answered. Patient requesting removal of ingrown nail today. Procedure below.  Discussed procedure and post procedure care and patient expressed understanding.  Keflex sent to pharmacy  Will follow-up in 2 weeks for nail check or sooner if any problems arise.    Procedure:  Procedure: partial Nail Avulsion of right hallux bilateral  nail border.  Surgeon: Lorenda Peck, DPM  Pre-op Dx: Ingrown toenail with and without infection Post-op: Same  Place of Surgery: Office exam room.  Indications for  surgery: Painful and ingrown toenail.    The patient is requesting removal of nail with chemical matrixectomy. Risks and complications were discussed with the patient for which they understand and written consent was obtained. Under sterile conditions a total of 3 mL of  1% lidocaine plain was infiltrated in a hallux block fashion. Once anesthetized, the skin was prepped in sterile fashion. A tourniquet was then applied. Next the bilateral  aspect of hallux nail border was then sharply excised making sure to remove the entire offending nail border.  Next phenol was then applied under standard conditions and copiously irrigated. Silvadene was applied. A dry sterile dressing was applied. After application of the dressing the tourniquet was removed and there is found to be an immediate capillary refill time to the digit. The patient tolerated the procedure well without any complications. Post procedure instructions were discussed the patient for which he verbally understood. Follow-up in two weeks for nail check or sooner if any problems are to arise. Discussed signs/symptoms of infection and directed to call the office immediately should any occur or go directly to the emergency room. In the meantime, encouraged to call the office with any questions, concerns, changes symptoms.   Lorenda Peck, DPM

## 2022-01-28 NOTE — Patient Instructions (Signed)

## 2022-01-28 NOTE — Telephone Encounter (Signed)
Fax received from [facility] stating that they will send the patient's prescription for Skyrizi over to [facility] since that's the request of the [employer].

## 2022-02-11 ENCOUNTER — Ambulatory Visit: Payer: 59 | Admitting: Podiatry

## 2022-02-11 ENCOUNTER — Encounter: Payer: Self-pay | Admitting: Podiatry

## 2022-02-11 ENCOUNTER — Other Ambulatory Visit: Payer: Self-pay

## 2022-02-11 DIAGNOSIS — B351 Tinea unguium: Secondary | ICD-10-CM

## 2022-02-11 DIAGNOSIS — L600 Ingrowing nail: Secondary | ICD-10-CM

## 2022-02-11 DIAGNOSIS — L603 Nail dystrophy: Secondary | ICD-10-CM

## 2022-02-11 NOTE — Progress Notes (Signed)
°  Subjective:  Patient ID: Rebecca Carroll, female    DOB: 1971/06/23,   MRN: [id number]  Chief Complaint  Patient presents with   Nail Problem    Nail check     51 y.o. female presents for follow-up of ingrown right great toenail removal. Patient states doing well with no pain and has been soaking.  . Denies any other pedal complaints. Denies n/v/f/c.   Past Medical History:  Diagnosis Date   Allergy    Anemia    Arthritis    Celiac disease    Diabetes mellitus without complication (HCC)    Essential hypertension, benign    Fibromyalgia    GERD (gastroesophageal reflux disease)    High cholesterol    History of anemia    Hypothyroidism    Migraine    Scalp psoriasis     Objective:  Physical Exam: Vascular: DP/PT pulses 2/4 bilateral. CFT <3 seconds. Normal hair growth on digits. No edema.  Skin. No lacerations or abrasions bilateral feet. Right hallux nail healing well no signs of infection noted. Left hallux nail thickened and elongated with new nail growing in normal.  Musculoskeletal: MMT 5/5 bilateral lower extremities in DF, PF, Inversion and Eversion. Deceased ROM in DF of ankle joint.  Neurological: Sensation intact to light touch.   Assessment:  No diagnosis found.   Plan:  Patient was evaluated and treated and all questions answered. Toe was evaluated and appears to be healing well.  May discontinue soaks and neosporin.  Left hallux nail discussed soaks and urea nail gel and waiting for top of the nail to fall off. Did debride some off today.  Patient to follow-up as needed.    Lorenda Peck, DPM

## 2022-03-14 ENCOUNTER — Telehealth: Payer: Self-pay | Admitting: Physician Assistant

## 2022-03-14 NOTE — Telephone Encounter (Signed)
error 

## 2022-03-26 ENCOUNTER — Ambulatory Visit: Payer: 59 | Admitting: Physician Assistant

## 2022-03-26 ENCOUNTER — Other Ambulatory Visit: Payer: Self-pay

## 2022-03-26 ENCOUNTER — Encounter: Payer: Self-pay | Admitting: Physician Assistant

## 2022-03-26 DIAGNOSIS — E119 Type 2 diabetes mellitus without complications: Secondary | ICD-10-CM | POA: Diagnosis not present

## 2022-03-26 DIAGNOSIS — L659 Nonscarring hair loss, unspecified: Secondary | ICD-10-CM

## 2022-03-26 DIAGNOSIS — R5383 Other fatigue: Secondary | ICD-10-CM

## 2022-03-27 ENCOUNTER — Telehealth: Payer: Self-pay | Admitting: *Deleted

## 2022-03-27 LAB — TSH: TSH: 2.08 mIU/L

## 2022-03-27 LAB — FERRITIN: Ferritin: 41 ng/mL (ref 16–232)

## 2022-03-27 MED ORDER — FUSION PLUS PO CAPS: 1.0000 | ORAL_CAPSULE | Freq: Every day | ORAL | 8 refills | Status: DC

## 2022-03-27 MED ORDER — FUSION PLUS PO CAPS: ORAL_CAPSULE | ORAL | 3 refills | Status: DC

## 2022-03-27 NOTE — Telephone Encounter (Signed)
Bloodwork results to patient- per [facility] wants patient to take iron supplement- sent to patient pharmacy.  ?

## 2022-04-11 ENCOUNTER — Encounter: Payer: Self-pay | Admitting: Physician Assistant

## 2022-04-11 NOTE — Progress Notes (Signed)
? ?  Follow-Up Visit ?  ?Subjective  ?Rebecca Carroll is a 51 y.o. female who presents for the following: Alopecia (Patient here today for hair loss x 5-6 months per patient it's getting worse. Patient states that she's tried multiple all natural shampoos with no improvement. Patient states that her PCP did blood work and everything came back normal. No family history of hair loss.  She was diagnosed with diabetes with her blood sugar in the 600's. Her thyroid was out of range and she felt really bad. It has been adjusted a few months ago.  It has been a lot of stress on her body.  ? ? ?The following portions of the chart were reviewed this encounter and updated as appropriate:  Tobacco  Allergies  Meds  Problems  Med Hx  Surg Hx  Fam Hx   ?  ? ?Objective  ?Well appearing patient in no apparent distress; mood and affect are within normal limits. ? ?All skin waist up examined. ? ?Mid Frontal Scalp ?Diffuse hair loss with breakage.  ? ? ?Assessment & Plan  ?Alopecia ?Mid Frontal Scalp ? ?Over the counter Rogaine  ? ?TSH - Mid Frontal Scalp ? ?Ferritin, Serum (Serial) - Mid Frontal Scalp ? ?Fatigue ?Related Procedures ?TSH ?Ferritin, Serum (Serial) ? ? ?Other Procedures Placed This Encounter ?Ferritin ? ? ?I, Jodel Mayhall, PA-C, have reviewed all documentation's for this visit.  The documentation on 04/11/22 for the exam, diagnosis, procedures and orders are all accurate and complete. ?

## 2022-08-13 ENCOUNTER — Telehealth: Payer: Self-pay | Admitting: Physician Assistant

## 2022-08-13 DIAGNOSIS — L4050 Arthropathic psoriasis, unspecified: Secondary | ICD-10-CM

## 2022-08-13 DIAGNOSIS — L409 Psoriasis, unspecified: Secondary | ICD-10-CM

## 2022-08-13 DIAGNOSIS — L659 Nonscarring hair loss, unspecified: Secondary | ICD-10-CM

## 2022-08-13 MED ORDER — SKYRIZI PEN 150 MG/ML ~~LOC~~ SOAJ: SUBCUTANEOUS | 6 refills | Status: AC

## 2022-08-13 MED ORDER — FUSION PLUS PO CAPS: 1.0000 | ORAL_CAPSULE | Freq: Every day | ORAL | 8 refills | Status: AC

## 2022-08-13 NOTE — Telephone Encounter (Signed)
Patient is calling to get refill on Fusion Plus Iron-FA-B Cmp-C-Biot-Probiotic (FUSION PLUS) CAPS And would like it sent in to [place] for a 3 month supply.  Patient also needs a refill on Skyrizi from [place].

## 2022-08-29 ENCOUNTER — Ambulatory Visit: Payer: 59 | Admitting: Physician Assistant

## 2023-03-26 ENCOUNTER — Ambulatory Visit: Payer: 59 | Admitting: Gastroenterology

## 2024-01-26 ENCOUNTER — Ambulatory Visit (INDEPENDENT_AMBULATORY_CARE_PROVIDER_SITE_OTHER): Payer: 59 | Admitting: Podiatry

## 2024-01-26 ENCOUNTER — Encounter: Payer: Self-pay | Admitting: Podiatry

## 2024-01-26 DIAGNOSIS — M722 Plantar fascial fibromatosis: Secondary | ICD-10-CM

## 2024-01-26 NOTE — Progress Notes (Signed)
  Subjective:  Patient ID: Rebecca Carroll, female    DOB: 02/27/1971,   MRN: [id number]  Chief Complaint  Patient presents with   Foot Pain    Pt presents for     53 y.o. female presents for concern of knot on the bottom of her left foot that developed about 6 months ago. Relates it has been sore in certain shoes and when walking barefoot. She wears Hokas which help denies any other treatments. Wanted to make sure it wasn't anything bad . Denies any other pedal complaints. Denies n/v/f/c.   Past Medical History:  Diagnosis Date   Allergy    Anemia    Arthritis    Celiac disease    Diabetes mellitus without complication (HCC)    Essential hypertension, benign    Fibromyalgia    GERD (gastroesophageal reflux disease)    High cholesterol    History of anemia    Hypothyroidism    Migraine    Scalp psoriasis     Objective:  Physical Exam: Vascular: DP/PT pulses 2/4 bilateral. CFT <3 seconds. Normal hair growth on digits. No edema.  Skin. No lacerations or abrasions bilateral feet.  Musculoskeletal: MMT 5/5 bilateral lower extremities in DF, PF, Inversion and Eversion. Deceased ROM in DF of ankle joint. Left plantar arch centrally there is a soft tissue mass that is firm about 1 cm in diameter. Mobile with the plantar fascia and some tenderness to palpation. No fluctuance noted. Neurological: Sensation intact to light touch.   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 X-rays reviewed and discussed with patient. No acute fractures or dislocations. Some thickening note din the plantar fascial area.  Discussed the diagnosis of fibroma and treatment options with patient. Discussed offloading of the area and proper shoe wear. Discussed steroid injection into the area.  Patient declined today. Did discuss surgical options and advised patient that fibroma resection is not 100%  guaranteed. Patient to follow-up as needed if symptoms fail to improve or worsen.   Louann Sjogren, DPM

## 2024-01-26 NOTE — Patient Instructions (Signed)
EXERCISES- RANGE OF MOTION (ROM) AND STRETCHING EXERCISES - Plantar Fasciitis (Heel Spur Syndrome) These exercises may help you when beginning to rehabilitate your injury. Your symptoms may resolve with or without further involvement from your physician, physical therapist or athletic trainer. While completing these exercises, remember:  Restoring tissue flexibility helps normal motion to return to the joints. This allows healthier, less painful movement and activity. An effective stretch should be held for at least 30 seconds. A stretch should never be painful. You should only feel a gentle lengthening or release in the stretched tissue.  RANGE OF MOTION - Toe Extension, Flexion Sit with your right / left leg crossed over your opposite knee. Grasp your toes and gently pull them back toward the top of your foot. You should feel a stretch on the bottom of your toes and/or foot. Hold this stretch for 10 seconds. Now, gently pull your toes toward the bottom of your foot. You should feel a stretch on the top of your toes and or foot. Hold this stretch for 10 seconds. Repeat  times. Complete this stretch 3 times per day.   RANGE OF MOTION - Ankle Dorsiflexion, Active Assisted Remove shoes and sit on a chair that is preferably not on a carpeted surface. Place right / left foot under knee. Extend your opposite leg for support. Keeping your heel down, slide your right / left foot back toward the chair until you feel a stretch at your ankle or calf. If you do not feel a stretch, slide your bottom forward to the edge of the chair, while still keeping your heel down. Hold this stretch for 10 seconds. Repeat 3 times. Complete this stretch 2 times per day.   STRETCH  Gastroc, Standing Place hands on wall. Extend right / left leg, keeping the front knee somewhat bent. Slightly point your toes inward on your back foot. Keeping your right / left heel on the floor and your knee straight, shift your  weight toward the wall, not allowing your back to arch. You should feel a gentle stretch in the right / left calf. Hold this position for 10 seconds. Repeat 3 times. Complete this stretch 2 times per day.  STRETCH  Soleus, Standing Place hands on wall. Extend right / left leg, keeping the other knee somewhat bent. Slightly point your toes inward on your back foot. Keep your right / left heel on the floor, bend your back knee, and slightly shift your weight over the back leg so that you feel a gentle stretch deep in your back calf. Hold this position for 10 seconds. Repeat 3 times. Complete this stretch 2 times per day.  STRETCH  Gastrocsoleus, Standing  Note: This exercise can place a lot of stress on your foot and ankle. Please complete this exercise only if specifically instructed by your caregiver.  Place the ball of your right / left foot on a step, keeping your other foot firmly on the same step. Hold on to the wall or a rail for balance. Slowly lift your other foot, allowing your body weight to press your heel down over the edge of the step. You should feel a stretch in your right / left calf. Hold this position for 10 seconds. Repeat this exercise with a slight bend in your right / left knee. Repeat 3 times. Complete this stretch 2 times per day.   STRENGTHENING EXERCISES - Plantar Fasciitis (Heel Spur Syndrome)  These exercises may help you when beginning to rehabilitate your  injury. They may resolve your symptoms with or without further involvement from your physician, physical therapist or athletic trainer. While completing these exercises, remember:  Muscles can gain both the endurance and the strength needed for everyday activities through controlled exercises. Complete these exercises as instructed by your physician, physical therapist or athletic trainer. Progress the resistance and repetitions only as guided.  STRENGTH - Towel Curls Sit in a chair positioned on a  non-carpeted surface. Place your foot on a towel, keeping your heel on the floor. Pull the towel toward your heel by only curling your toes. Keep your heel on the floor. Repeat 3 times. Complete this exercise 2 times per day.  STRENGTH - Ankle Inversion Secure one end of a rubber exercise band/tubing to a fixed object (table, pole). Loop the other end around your foot just before your toes. Place your fists between your knees. This will focus your strengthening at your ankle. Slowly, pull your big toe up and in, making sure the band/tubing is positioned to resist the entire motion. Hold this position for 10 seconds. Have your muscles resist the band/tubing as it slowly pulls your foot back to the starting position. Repeat 3 times. Complete this exercises 2 times per day.  Document Released: 12/16/2005 Document Revised: 03/09/2012 Document Reviewed: 03/30/2009 [facility] Patient Information 2014 [place], [place].
